# Patient Record
Sex: Male | Born: 1975 | Race: Black or African American | Hispanic: No | Marital: Single | Smoking: Current every day smoker
Health system: Southern US, Community
[De-identification: ages and names within clinical notes are randomized; demographics above are authoritative.]

---

## 2004-05-27 ENCOUNTER — Emergency Department (HOSPITAL_COMMUNITY): Admission: EM | Admit: 2004-05-27 | Discharge: 2004-05-27 | Payer: Self-pay | Admitting: Emergency Medicine

## 2004-11-29 ENCOUNTER — Emergency Department (HOSPITAL_COMMUNITY): Admission: EM | Admit: 2004-11-29 | Discharge: 2004-11-29 | Payer: Self-pay | Admitting: Emergency Medicine

## 2005-09-07 ENCOUNTER — Emergency Department (HOSPITAL_COMMUNITY): Admission: EM | Admit: 2005-09-07 | Discharge: 2005-09-07 | Payer: Self-pay | Admitting: Emergency Medicine

## 2008-10-20 ENCOUNTER — Emergency Department (HOSPITAL_COMMUNITY): Admission: EM | Admit: 2008-10-20 | Discharge: 2008-10-20 | Payer: Self-pay | Admitting: Emergency Medicine

## 2009-04-21 ENCOUNTER — Emergency Department (HOSPITAL_COMMUNITY): Admission: EM | Admit: 2009-04-21 | Discharge: 2009-04-21 | Payer: Self-pay | Admitting: Emergency Medicine

## 2009-06-03 ENCOUNTER — Emergency Department (HOSPITAL_COMMUNITY): Admission: EM | Admit: 2009-06-03 | Discharge: 2009-06-03 | Payer: Self-pay | Admitting: Family Medicine

## 2009-08-11 ENCOUNTER — Emergency Department (HOSPITAL_COMMUNITY): Admission: EM | Admit: 2009-08-11 | Discharge: 2009-08-11 | Payer: Self-pay | Admitting: Family Medicine

## 2009-08-17 ENCOUNTER — Ambulatory Visit: Payer: Self-pay | Admitting: Family Medicine

## 2009-08-17 DIAGNOSIS — K047 Periapical abscess without sinus: Secondary | ICD-10-CM

## 2009-08-17 DIAGNOSIS — K029 Dental caries, unspecified: Secondary | ICD-10-CM | POA: Insufficient documentation

## 2009-08-17 LAB — CONVERTED CEMR LAB
Basophils Absolute: 0 10*3/uL (ref 0.0–0.1)
Hemoglobin: 15.9 g/dL (ref 13.0–17.0)
Lymphocytes Relative: 41 % (ref 12–46)
Monocytes Absolute: 0.5 10*3/uL (ref 0.1–1.0)
Neutro Abs: 3.8 10*3/uL (ref 1.7–7.7)
Platelets: 238 10*3/uL (ref 150–400)
RDW: 14.4 % (ref 11.5–15.5)
Sed Rate: 1 mm/hr (ref 0–16)
Vit D, 25-Hydroxy: 18 ng/mL — ABNORMAL LOW (ref 30–89)

## 2009-08-18 ENCOUNTER — Telehealth (INDEPENDENT_AMBULATORY_CARE_PROVIDER_SITE_OTHER): Payer: Self-pay | Admitting: Family Medicine

## 2010-05-08 ENCOUNTER — Ambulatory Visit: Payer: Self-pay | Admitting: Nurse Practitioner

## 2010-05-08 DIAGNOSIS — B351 Tinea unguium: Secondary | ICD-10-CM | POA: Insufficient documentation

## 2010-05-08 DIAGNOSIS — E669 Obesity, unspecified: Secondary | ICD-10-CM | POA: Insufficient documentation

## 2010-05-08 LAB — CONVERTED CEMR LAB
ALT: 24 units/L (ref 0–53)
Alkaline Phosphatase: 55 units/L (ref 39–117)
Basophils Absolute: 0.1 10*3/uL (ref 0.0–0.1)
Creatinine, Ser: 1.11 mg/dL (ref 0.40–1.50)
Eosinophils Absolute: 0.4 10*3/uL (ref 0.0–0.7)
Eosinophils Relative: 6 % — ABNORMAL HIGH (ref 0–5)
Glucose, Bld: 94 mg/dL (ref 70–99)
HCT: 44.5 % (ref 39.0–52.0)
MCV: 93.1 fL (ref 78.0–100.0)
Monocytes Absolute: 0.5 10*3/uL (ref 0.1–1.0)
Nitrite: NEGATIVE
Platelets: 189 10*3/uL (ref 150–400)
RDW: 14.6 % (ref 11.5–15.5)
Sodium: 143 meq/L (ref 135–145)
Specific Gravity, Urine: <1.005
Total Bilirubin: 0.8 mg/dL (ref 0.3–1.2)
Total Protein: 6.8 g/dL (ref 6.0–8.3)
WBC Urine, dipstick: NEGATIVE
pH: 6

## 2010-05-09 ENCOUNTER — Encounter (INDEPENDENT_AMBULATORY_CARE_PROVIDER_SITE_OTHER): Payer: Self-pay | Admitting: Internal Medicine

## 2010-05-09 ENCOUNTER — Telehealth (INDEPENDENT_AMBULATORY_CARE_PROVIDER_SITE_OTHER): Payer: Self-pay | Admitting: Nurse Practitioner

## 2010-05-10 ENCOUNTER — Encounter (INDEPENDENT_AMBULATORY_CARE_PROVIDER_SITE_OTHER): Payer: Self-pay | Admitting: Nurse Practitioner

## 2010-09-17 IMAGING — CR DG SHOULDER 2+V*L*
3 series · 3 of 3 positions shown · non-contrast
Comparison: None

CLINICAL DATA: Left shoulder pain, injury

LEFT SHOULDER - 2+ VIEW

[w shoulder ap external left]
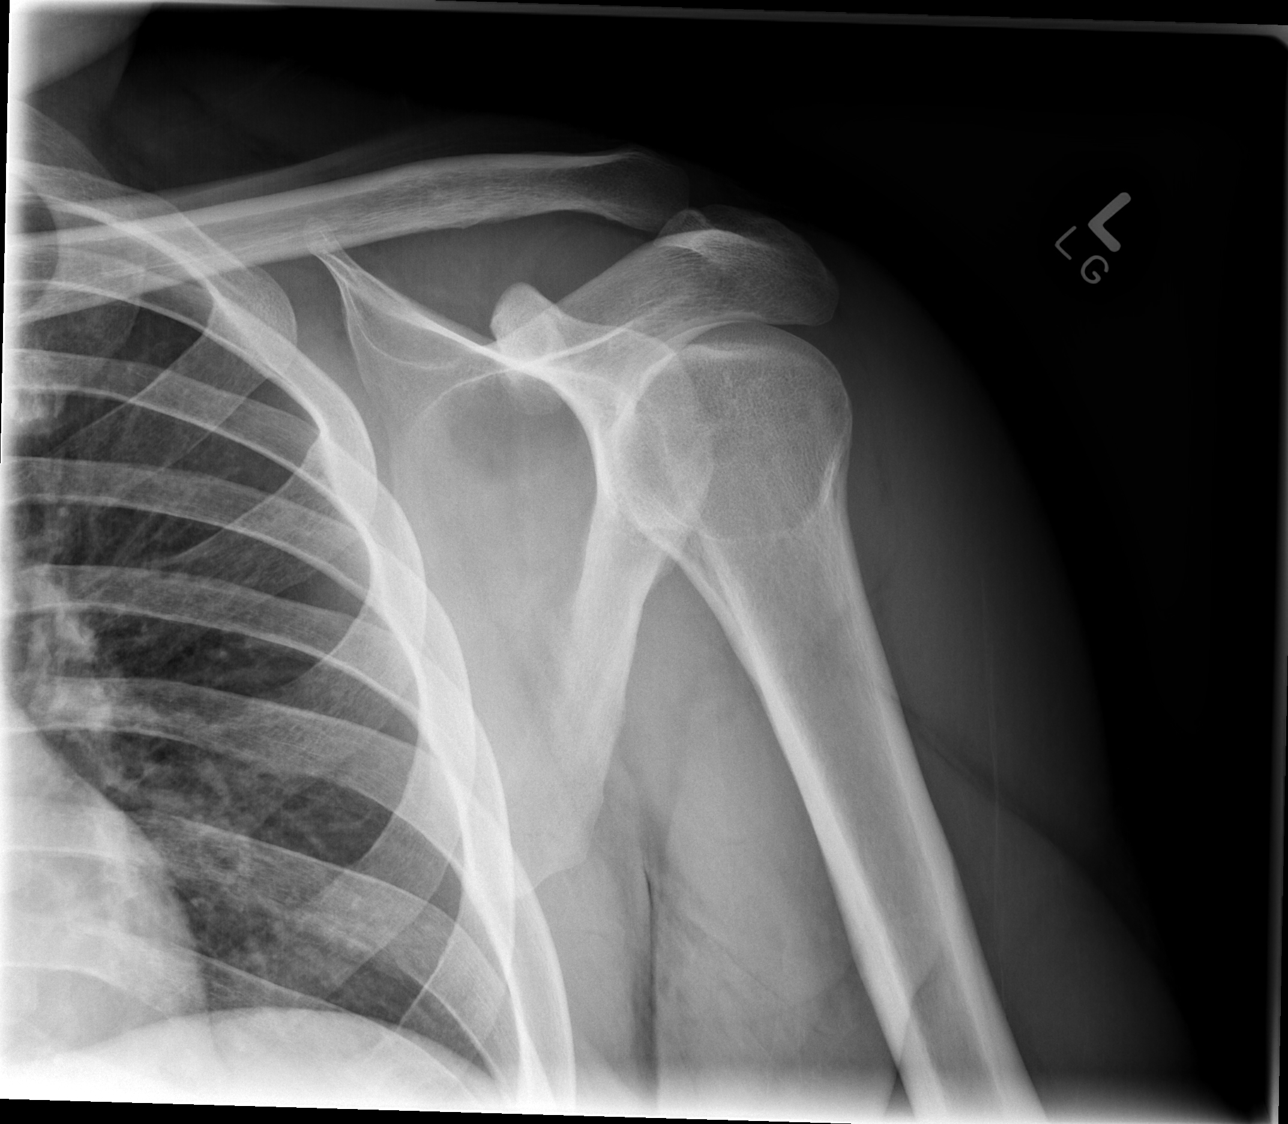

[w shoulder ap internal left]
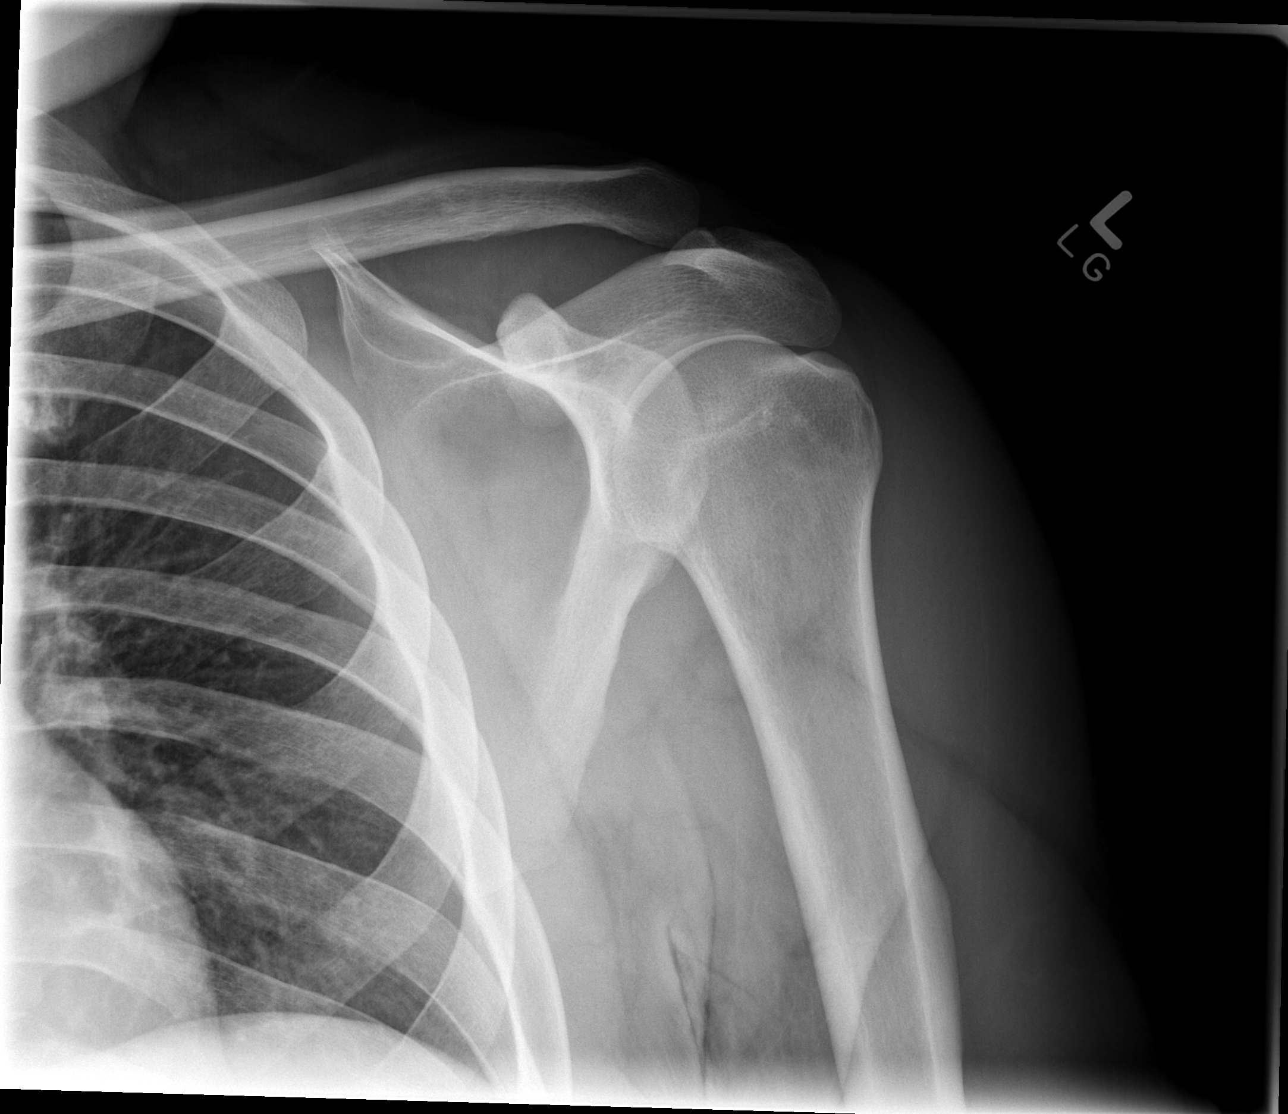

[w shoulder y view left *]
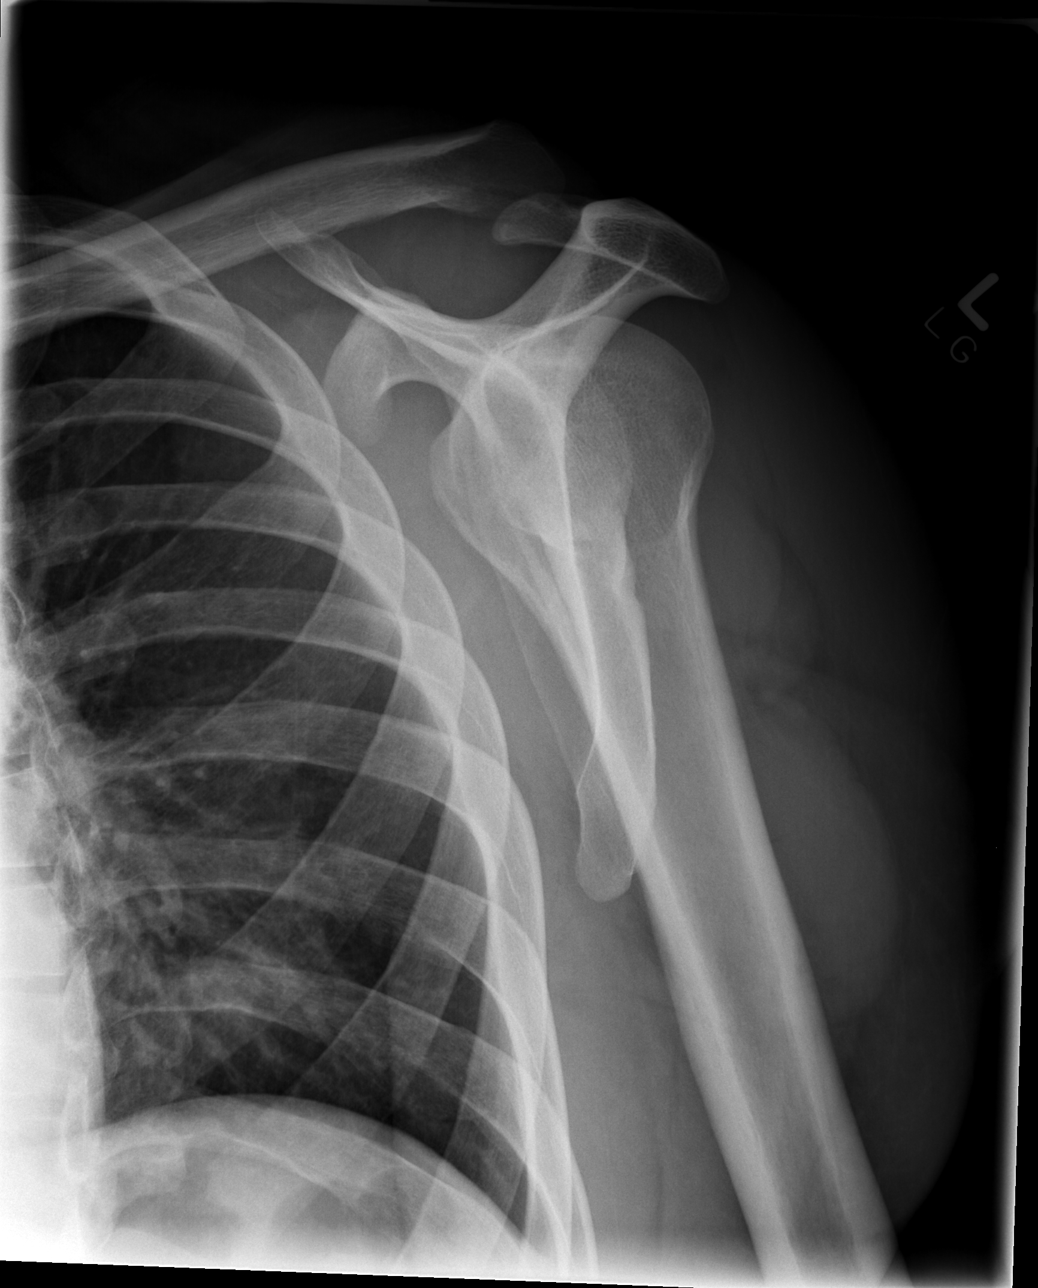

[3 of 3 positions shown; findings below may reference images not displayed]

FINDINGS: Bone mineralization normal.
AC joint alignment normal.
No glenohumeral fracture or dislocation.
Visualized left ribs unremarkable.
IMPRESSION: No acute left shoulder abnormalities.

## 2010-10-31 NOTE — Letter (Signed)
Summary: *HSN Results Follow up  [facility]  [address] [place], [place] [address]   Phone: [phone]  Fax: [phone]      05/10/2010   Bakari Rusher [address] [place], [place]  [address]   Dear  Mr. Hadrian Blanchard,                            ____S.Drinkard,FNP   ____D. Gore,FNP       ____B. McPherson,MD   ____V. Rankins,MD    ____E. Mulberry,MD    __X__N. Daphine Deutscher, FNP  ____D. Reche Dixon, MD    ____K. Philipp Deputy, MD    ____Other     This letter is to inform you that your recent test(s):  _______Pap Smear    ___X____Lab Test     _______X-ray    ___X____ is within acceptable limits  _______ requires a medication change  _______ requires a follow-up lab visit  _______ requires a follow-up visit with your provider   Comments: labs done during recent office visit are normal.       _________________________________________________________ If you have any questions, please contact our office [phone].                    Sincerely,    Lehman Prom FNP [facility]

## 2010-10-31 NOTE — Letter (Signed)
Summary: PT INFORMATION SHEET  PT INFORMATION SHEET   Imported By: Arta Bruce 10/03/2009 14:18:00  _____________________________________________________________________  External Attachment:    Type:   Image     Comment:   External Document

## 2010-10-31 NOTE — Progress Notes (Signed)
Summary: no urine  Phone Note From Other Clinic   Summary of Call: No urine for gc/chl.  received. Initial call taken by: Vesta Mixer CMA,  May 09, 2010 4:09 PM  Follow-up for Phone Call        notify pt that urine was not sent for gon/chl as ordered.  If he wishes to have this STD testing he can return (at no charge) and give a urine sample Follow-up by: Lehman Prom FNP,  May 10, 2010 8:25 AM  Additional Follow-up for Phone Call Additional follow up Details #1::        pt will come in today and give  a specimen at no charge. Additional Follow-up by: Levon Hedger,  May 10, 2010 9:24 AM

## 2010-10-31 NOTE — Assessment & Plan Note (Signed)
Summary: Onychomycosis   Vital Signs:  Patient profile:   35 year old male Weight:      264.4 pounds BMI:     38.07 Pulse rate:   72 / minute Pulse rhythm:   regular Resp:     16 per minute BP sitting:   136 / 70  (left arm) Cuff size:   large  Vitals Entered By: Levon Hedger (May 08, 2010 11:01 AM)  Nutrition Counseling: Patient's BMI is greater than 25 and therefore counseled on weight management options. CC: right foot big toenail needs cutting very long, thick and curving...dog bite on right leg that happened about 3  weeks ago Is Patient Diabetic? No Pain Assessment Patient in pain? no       Does patient need assistance? Functional Status Self care Ambulation Normal Comments pt is currently not taking any medications   Primary Care Provider:  Carolyne Fiscal  CC:  right foot big toenail needs cutting very long and thick and curving...dog bite on right leg that happened about 3  weeks ago.  History of Present Illness:  Pt into the office with complaints of right toenail discoloration. Noted over the past year denies any trauma to toe - no pain only the right great toe affected, no other nails.  Right shin - s/p dog bite 3 weeks ago. Area has still not healed and is draining Foul odor tdap - updated during last visit  Habits & Providers  Alcohol-Tobacco-Diet     Tobacco Status: current  Exercise-Depression-Behavior     Does Patient Exercise: no     Drug Use: yes  Allergies (verified): No Known Drug Allergies  Review of Systems CV:  Denies chest pain or discomfort. Resp:  Denies cough. GI:  Denies abdominal pain, nausea, and vomiting. Derm:  Complains of lesion(s).  Physical Exam  General:  alert.   Head:  normocephalic.   Lungs:  normal breath sounds.   Heart:  normal rate and regular rhythm.   Neurologic:  alert & oriented X3.   Skin:  right shin - crated lesion, exudate (serous) Psych:  Oriented X3.     Impression &  Recommendations:  Problem # 1:  ONYCHOMYCOSIS (ICD-110.1) will refer to podiatry advised pt that labs will be done to check for other causes Orders: T-CBC w/Diff ([id number]) T-Comprehensive Metabolic Panel ([id number]) Rapid HIV  ([id number])  Problem # 2:  DOG BITE (ICD-E906.0) tdap up to date will start on keflex Orders: T-Culture, Wound ([id number])  Complete Medication List: 1)  Cephalexin 500 Mg Caps (Cephalexin) .... One capsule by mouth three times a day for infection  Other Orders: T-GC Probe, urine ([id number]) T-TSH ([phone])  Patient Instructions: 1)  Toe nail - will check labs to be sure there is no other cause for thick toenails. You will be referred to podiatry. 2)  Schedule an appointment to renew your orange card. 3)  Labs will be checked today and you will be notified of the results 4)  left leg - keep area clean.  Keep covered with bandaid while it is still draining.  Take antibiotic Keflex 500mg  by mouth three times a day x 10 days.  apply ointment (samples given) to area daily and cover with a bandaid 5)  Follow up as needed 6)  Schedule an appointment at [facility] for podiatry Prescriptions: CEPHALEXIN 500 MG CAPS (CEPHALEXIN) One capsule by mouth three times a day for infection  #30 x 0   Entered and Authorized by:   Lehman Prom  FNP   Signed by:   Lehman Prom FNP on 05/08/2010   Method used:   Print then Give to Patient   RxID:   [id number]   Laboratory Results   Urine Tests  Date/Time Received: May 08, 2010 1:54 PM  Date/Time Reported: May 08, 2010 1:54 PM   Routine Urinalysis   Color: lt. yellow Glucose: negative   (Normal Range: Negative) Bilirubin: negative   (Normal Range: Negative) Ketone: negative   (Normal Range: Negative) Spec. Gravity: <1.005   (Normal Range: 1.003-1.035) Blood: negative   (Normal Range: Negative) pH: 6.0   (Normal Range: 5.0-8.0) Protein: negative   (Normal Range:  Negative) Urobilinogen: 0.2   (Normal Range: 0-1) Nitrite: negative   (Normal Range: Negative) Leukocyte Esterace: negative   (Normal Range: Negative)    Date/Time Received: May 08, 2010 1:55 PM  Date/Time Reported: May 08, 2010 1:55 PM   Other Tests  Rapid HIV: negative

## 2012-09-10 ENCOUNTER — Emergency Department (HOSPITAL_COMMUNITY)
Admission: EM | Admit: 2012-09-10 | Discharge: 2012-09-10 | Disposition: A | Discharge status: Home / Self Care | Payer: Self-pay | Attending: Emergency Medicine | Admitting: Emergency Medicine

## 2012-09-10 ENCOUNTER — Encounter (HOSPITAL_COMMUNITY): Payer: Self-pay

## 2012-09-10 DIAGNOSIS — Y92410 Unspecified street and highway as the place of occurrence of the external cause: Secondary | ICD-10-CM | POA: Insufficient documentation

## 2012-09-10 DIAGNOSIS — M542 Cervicalgia: Secondary | ICD-10-CM

## 2012-09-10 DIAGNOSIS — S0993XA Unspecified injury of face, initial encounter: Secondary | ICD-10-CM | POA: Insufficient documentation

## 2012-09-10 DIAGNOSIS — Y9389 Activity, other specified: Secondary | ICD-10-CM | POA: Insufficient documentation

## 2012-09-10 DIAGNOSIS — F17200 Nicotine dependence, unspecified, uncomplicated: Secondary | ICD-10-CM | POA: Insufficient documentation

## 2012-09-10 MED ORDER — IBUPROFEN 400 MG PO TABS
800.0000 mg | ORAL_TABLET | Freq: Once | ORAL | Status: AC
  Administered 2012-09-10: 800 mg via ORAL
  Filled 2012-09-10: qty 2

## 2012-09-10 MED ORDER — IBUPROFEN 600 MG PO TABS: 600.0000 mg | ORAL_TABLET | Freq: Four times a day (QID) | ORAL | Status: DC | PRN

## 2012-09-10 MED ORDER — CYCLOBENZAPRINE HCL 10 MG PO TABS
10.0000 mg | ORAL_TABLET | Freq: Once | ORAL | Status: AC
  Administered 2012-09-10: 10 mg via ORAL
  Filled 2012-09-10: qty 1

## 2012-09-10 MED ORDER — CYCLOBENZAPRINE HCL 10 MG PO TABS
10.0000 mg | ORAL_TABLET | Freq: Three times a day (TID) | ORAL | Status: DC | PRN
Start: 2012-09-10 — End: 2015-03-31
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
## 2012-09-11 NOTE — ED Provider Notes (Signed)
Medical screening examination/treatment/procedure(s) were performed by non-physician practitioner and as supervising physician I was immediately available for consultation/collaboration.   Laray Anger, DO 09/11/12 1233

## 2013-04-21 ENCOUNTER — Emergency Department (HOSPITAL_COMMUNITY)
Admission: EM | Admit: 2013-04-21 | Discharge: 2013-04-21 | Disposition: A | Discharge status: Home / Self Care | Payer: Self-pay | Source: Home / Self Care | Attending: Family Medicine | Admitting: Family Medicine

## 2013-04-21 ENCOUNTER — Encounter (HOSPITAL_COMMUNITY): Payer: Self-pay | Admitting: *Deleted

## 2013-04-21 DIAGNOSIS — S0510XA Contusion of eyeball and orbital tissues, unspecified eye, initial encounter: Secondary | ICD-10-CM

## 2013-04-21 DIAGNOSIS — S0592XA Unspecified injury of left eye and orbit, initial encounter: Secondary | ICD-10-CM

## 2013-04-21 MED ORDER — IBUPROFEN 800 MG PO TABS
800.0000 mg | ORAL_TABLET | Freq: Once | ORAL | Status: AC
  Administered 2013-04-21: 800 mg via ORAL

## 2013-04-21 MED ORDER — HYDROCODONE-ACETAMINOPHEN 5-325 MG PO TABS: 1.0000 | ORAL_TABLET | Freq: Four times a day (QID) | ORAL | Status: DC | PRN

## 2013-04-21 MED ORDER — IBUPROFEN 800 MG PO TABS
ORAL_TABLET | ORAL | Status: AC
  Filled 2013-04-21: qty 1

## 2013-04-21 MED ORDER — TOBRAMYCIN 0.3 % OP OINT
TOPICAL_OINTMENT | Freq: Four times a day (QID) | OPHTHALMIC | Status: DC
  Administered 2013-04-21: 19:00:00 via OPHTHALMIC

## 2013-04-21 MED ORDER — TETRACAINE HCL 0.5 % OP SOLN
OPHTHALMIC | Status: AC
  Filled 2013-04-21: qty 2

## 2013-04-21 MED ORDER — TOBRAMYCIN 0.3 % OP OINT
TOPICAL_OINTMENT | OPHTHALMIC | Status: AC
  Filled 2013-04-21: qty 3.5
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
## 2013-04-21 NOTE — ED Provider Notes (Addendum)
   History    CSN: [id number] Arrival date & time 04/21/13  1843  None    Chief Complaint  Patient presents with  . Eye Pain   (Consider location/radiation/quality/duration/timing/severity/associated sxs/prior Treatment) Patient is a 37 y.o. male presenting with eye pain. The history is provided by the patient.  Eye Pain This is a new problem. The current episode started 2 days ago (struck by tree limb in left eye when omving limbs with cousin.). The problem has been gradually improving. Associated symptoms include headaches.   History reviewed. No pertinent past medical history. History reviewed. No pertinent past surgical history. Family History  Problem Relation Age of Onset  . Hypertension Mother    History  Substance Use Topics  . Smoking status: Current Every Day Smoker -- 1.00 packs/day    Types: Cigarettes  . Smokeless tobacco: Not on file  . Alcohol Use: Yes     Comment: occasionally    Review of Systems  Constitutional: Negative.   Eyes: Positive for pain and redness. Negative for photophobia, discharge, itching and visual disturbance.  Neurological: Positive for headaches.    Allergies  Review of patient's allergies indicates no known allergies.  Home Medications   Current Outpatient Rx  Name  Route  Sig  Dispense  Refill  . cyclobenzaprine (FLEXERIL) 10 MG tablet   Oral   Take 1 tablet (10 mg total) by mouth 3 (three) times daily as needed for muscle spasms.   30 tablet   0   . HYDROcodone-acetaminophen (NORCO/VICODIN) 5-325 MG per tablet   Oral   Take 1 tablet by mouth every 6 (six) hours as needed for pain.   6 tablet   0   . ibuprofen (ADVIL,MOTRIN) 600 MG tablet   Oral   Take 1 tablet (600 mg total) by mouth every 6 (six) hours as needed for pain.   30 tablet   0    BP 129/81  Pulse 74  Temp(Src) 98.7 F (37.1 C) (Oral)  Resp 18  SpO2 97% Physical Exam  Nursing note and vitals reviewed. Constitutional: He appears well-developed and  well-nourished. He appears distressed.  Eyes: EOM and lids are normal. Pupils are equal, round, and reactive to light. No foreign bodies found. Right eye exhibits no discharge. Left eye exhibits no discharge. No foreign body present in the left eye. Left conjunctiva is injected. Left conjunctiva has no hemorrhage.  Slit lamp exam:      The left eye shows no corneal abrasion, no corneal ulcer, no foreign body and no fluorescein uptake.  Neck: Normal range of motion. Neck supple.    ED Course  Procedures (including critical care time) Labs Reviewed - No data to display No results found. 1. Eye injury, left, initial encounter     MDM    Linna Hoff, MD 04/21/13 1940  Linna Hoff, MD 04/21/13 2034

## 2015-03-31 ENCOUNTER — Emergency Department (HOSPITAL_COMMUNITY)
Admission: EM | Admit: 2015-03-31 | Discharge: 2015-03-31 | Disposition: A | Discharge status: Home / Self Care | Payer: Self-pay | Attending: Emergency Medicine | Admitting: Emergency Medicine

## 2015-03-31 ENCOUNTER — Encounter (HOSPITAL_COMMUNITY): Payer: Self-pay | Admitting: Emergency Medicine

## 2015-03-31 ENCOUNTER — Emergency Department (HOSPITAL_COMMUNITY): Payer: Self-pay

## 2015-03-31 DIAGNOSIS — Z720 Tobacco use: Secondary | ICD-10-CM | POA: Insufficient documentation

## 2015-03-31 DIAGNOSIS — R079 Chest pain, unspecified: Secondary | ICD-10-CM | POA: Insufficient documentation

## 2015-03-31 LAB — BASIC METABOLIC PANEL
Anion gap: 7 (ref 5–15)
BUN: 9 mg/dL (ref 6–20)
CHLORIDE: 106 mmol/L (ref 101–111)
CO2: 29 mmol/L (ref 22–32)
Calcium: 9 mg/dL (ref 8.9–10.3)
Creatinine, Ser: 1.13 mg/dL (ref 0.61–1.24)
GFR calc Af Amer: >60 mL/min (ref >60)
GLUCOSE: 141 mg/dL — AB (ref 65–99)
POTASSIUM: 3.3 mmol/L — AB (ref 3.5–5.1)
SODIUM: 142 mmol/L (ref 135–145)

## 2015-03-31 LAB — CBC WITH DIFFERENTIAL/PLATELET
Basophils Absolute: 0 10*3/uL (ref 0.0–0.1)
Basophils Relative: 0 % (ref 0–1)
EOS ABS: 0.3 10*3/uL (ref 0.0–0.7)
Eosinophils Relative: 3 % (ref 0–5)
HCT: 43.8 % (ref 39.0–52.0)
Hemoglobin: 15.4 g/dL (ref 13.0–17.0)
LYMPHS PCT: 26 % (ref 12–46)
Lymphs Abs: 3.1 10*3/uL (ref 0.7–4.0)
MCH: 32.8 pg (ref 26.0–34.0)
MCHC: 35.2 g/dL (ref 30.0–36.0)
MCV: 93.2 fL (ref 78.0–100.0)
MONOS PCT: 5 % (ref 3–12)
Monocytes Absolute: 0.6 10*3/uL (ref 0.1–1.0)
NEUTROS PCT: 66 % (ref 43–77)
Neutro Abs: 7.8 10*3/uL — ABNORMAL HIGH (ref 1.7–7.7)
PLATELETS: 176 10*3/uL (ref 150–400)
RBC: 4.7 MIL/uL (ref 4.22–5.81)
RDW: 14.5 % (ref 11.5–15.5)
WBC: 11.8 10*3/uL — ABNORMAL HIGH (ref 4.0–10.5)

## 2015-03-31 LAB — TROPONIN I

## 2015-03-31 LAB — D-DIMER, QUANTITATIVE (NOT AT ARMC)

## 2015-03-31 MED ORDER — MORPHINE SULFATE 4 MG/ML IJ SOLN
4.0000 mg | Freq: Once | INTRAMUSCULAR | Status: AC
  Administered 2015-03-31: 4 mg via INTRAVENOUS
  Filled 2015-03-31: qty 1

## 2015-03-31 MED ORDER — ONDANSETRON HCL 4 MG/2ML IJ SOLN
4.0000 mg | Freq: Once | INTRAMUSCULAR | Status: AC
  Administered 2015-03-31: 4 mg via INTRAVENOUS
  Filled 2015-03-31: qty 2

## 2015-03-31 MED ORDER — NAPROXEN 500 MG PO TABS: 500.0000 mg | ORAL_TABLET | Freq: Two times a day (BID) | ORAL | Status: DC

## 2015-03-31 MED ORDER — KETOROLAC TROMETHAMINE 30 MG/ML IJ SOLN
30.0000 mg | Freq: Once | INTRAMUSCULAR | Status: AC
  Administered 2015-03-31: 30 mg via INTRAVENOUS
  Filled 2015-03-31: qty 1

## 2015-03-31 MED ORDER — TRAMADOL HCL 50 MG PO TABS: 50.0000 mg | ORAL_TABLET | Freq: Four times a day (QID) | ORAL | Status: DC | PRN

## 2015-03-31 NOTE — ED Notes (Signed)
Per Dr. Preston FleetingGlick okay for pt to have water

## 2015-03-31 NOTE — Discharge Instructions (Signed)
Chest Pain (Nonspecific) °It is often hard to give a specific diagnosis for the cause of chest pain. There is always a chance that your pain could be related to something serious, such as a heart attack or a blood clot in the lungs. You need to follow up with your health care provider for further evaluation. °CAUSES  °· Heartburn. °· Pneumonia or bronchitis. °· Anxiety or stress. °· Inflammation around your heart (pericarditis) or lung (pleuritis or pleurisy). °· A blood clot in the lung. °· A collapsed lung (pneumothorax). It can develop suddenly on its own (spontaneous pneumothorax) or from trauma to the chest. °· Shingles infection (herpes zoster virus). °The chest wall is composed of bones, muscles, and cartilage. Any of these can be the source of the pain. °· The bones can be bruised by injury. °· The muscles or cartilage can be strained by coughing or overwork. °· The cartilage can be affected by inflammation and become sore (costochondritis). °DIAGNOSIS  °Lab tests or other studies may be needed to find the cause of your pain. Your health care provider may have you take a test called an ambulatory electrocardiogram (ECG). An ECG records your heartbeat patterns over a 24-hour period. You may also have other tests, such as: °· Transthoracic echocardiogram (TTE). During echocardiography, sound waves are used to evaluate how blood flows through your heart. °· Transesophageal echocardiogram (TEE). °· Cardiac monitoring. This allows your health care provider to monitor your heart rate and rhythm in real time. °· Holter monitor. This is a portable device that records your heartbeat and can help diagnose heart arrhythmias. It allows your health care provider to track your heart activity for several days, if needed. °· Stress tests by exercise or by giving medicine that makes the heart beat faster. °TREATMENT  °· Treatment depends on what may be causing your chest pain. Treatment may include: °¨ Acid blockers for  heartburn. °¨ Anti-inflammatory medicine. °¨ Pain medicine for inflammatory conditions. °¨ Antibiotics if an infection is present. °· You may be advised to change lifestyle habits. This includes stopping smoking and avoiding alcohol, caffeine, and chocolate. °· You may be advised to keep your head raised (elevated) when sleeping. This reduces the chance of acid going backward from your stomach into your esophagus. °Most of the time, nonspecific chest pain will improve within 2-3 days with rest and mild pain medicine.  °HOME CARE INSTRUCTIONS  °· If antibiotics were prescribed, take them as directed. Finish them even if you start to feel better. °· For the next few days, avoid physical activities that bring on chest pain. Continue physical activities as directed. °· Do not use any tobacco products, including cigarettes, chewing tobacco, or electronic cigarettes. °· Avoid drinking alcohol. °· Only take medicine as directed by your health care provider. °· Follow your health care provider's suggestions for further testing if your chest pain does not go away. °· Keep any follow-up appointments you made. If you do not go to an appointment, you could develop lasting (chronic) problems with pain. If there is any problem keeping an appointment, call to reschedule. °SEEK MEDICAL CARE IF:  °· Your chest pain does not go away, even after treatment. °· You have a rash with blisters on your chest. °· You have a fever. °SEEK IMMEDIATE MEDICAL CARE IF:  °· You have increased chest pain or pain that spreads to your arm, neck, jaw, back, or abdomen. °· You have shortness of breath. °· You have an increasing cough, or you cough   up blood. °· You have severe back or abdominal pain. °· You feel nauseous or vomit. °· You have severe weakness. °· You faint. °· You have chills. °This is an emergency. Do not wait to see if the pain will go away. Get medical help at once. Call your local emergency services (911 in [place] Do not drive  yourself to the hospital. °MAKE SURE YOU:  °· Understand these instructions. °· Will watch your condition. °· Will get help right away if you are not doing well or get worse. °Document Released: 06/26/2005 Document Revised: 09/21/2013 Document Reviewed: 04/21/2008 °[employer]® Patient Information ©2015 [employer]. This information is not intended to replace advice given to you by your health care provider. Make sure you discuss any questions you have with your health care provider. ° °Naproxen and naproxen sodium oral immediate-release tablets °What is this medicine? °NAPROXEN (na PROX en) is a non-steroidal anti-inflammatory drug (NSAID). It is used to reduce swelling and to treat pain. This medicine may be used for dental pain, headache, or painful monthly periods. It is also used for painful joint and muscular problems such as arthritis, tendinitis, bursitis, and gout. °This medicine may be used for other purposes; ask your health care provider or pharmacist if you have questions. °COMMON BRAND NAME(S): Aflaxen, Aleve, Aleve Arthritis, All Day Relief, Anaprox, Anaprox DS, Naprosyn °What should I tell my health care provider before I take this medicine? °They need to know if you have any of these conditions: °-asthma °-cigarette smoker °-drink more than 3 alcohol containing drinks a day °-heart disease or circulation problems such as heart failure or leg edema (fluid retention) °-high blood pressure °-kidney disease °-liver disease °-stomach bleeding or ulcers °-an unusual or allergic reaction to naproxen, aspirin, other NSAIDs, other medicines, foods, dyes, or preservatives °-pregnant or trying to get pregnant °-breast-feeding °How should I use this medicine? °Take this medicine by mouth with a glass of water. Follow the directions on the prescription label. Take it with food if your stomach gets upset. Try to not lie down for at least 10 minutes after you take it. Take your medicine at regular intervals. Do not  take your medicine more often than directed. Long-term, continuous use may increase the risk of heart attack or stroke. °A special MedGuide will be given to you by the pharmacist with each prescription and refill. Be sure to read this information carefully each time. °Talk to your pediatrician regarding the use of this medicine in children. Special care may be needed. °Overdosage: If you think you have taken too much of this medicine contact a poison control center or emergency room at once. °NOTE: This medicine is only for you. Do not share this medicine with others. °What if I miss a dose? °If you miss a dose, take it as soon as you can. If it is almost time for your next dose, take only that dose. Do not take double or extra doses. °What may interact with this medicine? °-alcohol °-aspirin °-cidofovir °-diuretics °-lithium °-methotrexate °-other drugs for inflammation like ketorolac or prednisone °-pemetrexed °-probenecid °-warfarin °This list may not describe all possible interactions. Give your health care provider a list of all the medicines, herbs, non-prescription drugs, or dietary supplements you use. Also tell them if you smoke, drink alcohol, or use illegal drugs. Some items may interact with your medicine. °What should I watch for while using this medicine? °Tell your doctor or health care professional if your pain does not get better. Talk to your   doctor before taking another medicine for pain. Do not treat yourself. This medicine does not prevent heart attack or stroke. In fact, this medicine may increase the chance of a heart attack or stroke. The chance may increase with longer use of this medicine and in people who have heart disease. If you take aspirin to prevent heart attack or stroke, talk with your doctor or health care professional. Do not take other medicines that contain aspirin, ibuprofen, or naproxen with this medicine. Side effects such as stomach upset, nausea, or ulcers may be more  likely to occur. Many medicines available without a prescription should not be taken with this medicine. This medicine can cause ulcers and bleeding in the stomach and intestines at any time during treatment. Do not smoke cigarettes or drink alcohol. These increase irritation to your stomach and can make it more susceptible to damage from this medicine. Ulcers and bleeding can happen without warning symptoms and can cause death. You may get drowsy or dizzy. Do not drive, use machinery, or do anything that needs mental alertness until you know how this medicine affects you. Do not stand or sit up quickly, especially if you are an older patient. This reduces the risk of dizzy or fainting spells. This medicine can cause you to bleed more easily. Try to avoid damage to your teeth and gums when you brush or floss your teeth. What side effects may I notice from receiving this medicine? Side effects that you should report to your doctor or health care professional as soon as possible: -black or bloody stools, blood in the urine or vomit -blurred vision -chest pain -difficulty breathing or wheezing -nausea or vomiting -severe stomach pain -skin rash, skin redness, blistering or peeling skin, hives, or itching -slurred speech or weakness on one side of the body -swelling of eyelids, throat, lips -unexplained weight gain or swelling -unusually weak or tired -yellowing of eyes or skin Side effects that usually do not require medical attention (report to your doctor or health care professional if they continue or are bothersome): -constipation -headache -heartburn This list may not describe all possible side effects. Call your doctor for medical advice about side effects. You may report side effects to FDA at 1-800-FDA-1088. Where should I keep my medicine? Keep out of the reach of children. Store at room temperature between 15 and 30 degrees C (59 and 86 degrees F). Keep container tightly closed. Throw  away any unused medicine after the expiration date. NOTE: This sheet is a summary. It may not cover all possible information. If you have questions about this medicine, talk to your doctor, pharmacist, or health care provider.  2015, Elsevier/Gold Standard. (2009-09-18 20:10:16)  Tramadol tablets What is this medicine? TRAMADOL (TRA ma dole) is a pain reliever. It is used to treat moderate to severe pain in adults. This medicine may be used for other purposes; ask your health care provider or pharmacist if you have questions. COMMON BRAND NAME(S): Ultram What should I tell my health care provider before I take this medicine? They need to know if you have any of these conditions: -brain tumor -depression -drug abuse or addiction -head injury -if you frequently drink alcohol containing drinks -kidney disease or trouble passing urine -liver disease -lung disease, asthma, or breathing problems -seizures or epilepsy -suicidal thoughts, plans, or attempt; a previous suicide attempt by you or a family member -an unusual or allergic reaction to tramadol, codeine, other medicines, foods, dyes, or preservatives -pregnant or trying to get  pregnant -breast-feeding How should I use this medicine? Take this medicine by mouth with a full glass of water. Follow the directions on the prescription label. If the medicine upsets your stomach, take it with food or milk. Do not take more medicine than you are told to take. Talk to your pediatrician regarding the use of this medicine in children. Special care may be needed. Overdosage: If you think you have taken too much of this medicine contact a poison control center or emergency room at once. NOTE: This medicine is only for you. Do not share this medicine with others. What if I miss a dose? If you miss a dose, take it as soon as you can. If it is almost time for your next dose, take only that dose. Do not take double or extra doses. What may interact  with this medicine? Do not take this medicine with any of the following medications: -MAOIs like Carbex, Eldepryl, Marplan, Nardil, and Parnate This medicine may also interact with the following medications: -alcohol or medicines that contain alcohol -antihistamines -benzodiazepines -bupropion -carbamazepine or oxcarbazepine -clozapine -cyclobenzaprine -digoxin -furazolidone -linezolid -medicines for depression, anxiety, or psychotic disturbances -medicines for migraine headache like almotriptan, eletriptan, frovatriptan, naratriptan, rizatriptan, sumatriptan, zolmitriptan -medicines for pain like pentazocine, buprenorphine, butorphanol, meperidine, nalbuphine, and propoxyphene -medicines for sleep -muscle relaxants -naltrexone -phenobarbital -phenothiazines like perphenazine, thioridazine, chlorpromazine, mesoridazine, fluphenazine, prochlorperazine, promazine, and trifluoperazine -procarbazine -warfarin This list may not describe all possible interactions. Give your health care provider a list of all the medicines, herbs, non-prescription drugs, or dietary supplements you use. Also tell them if you smoke, drink alcohol, or use illegal drugs. Some items may interact with your medicine. What should I watch for while using this medicine? Tell your doctor or health care professional if your pain does not go away, if it gets worse, or if you have new or a different type of pain. You may develop tolerance to the medicine. Tolerance means that you will need a higher dose of the medicine for pain relief. Tolerance is normal and is expected if you take this medicine for a long time. Do not suddenly stop taking your medicine because you may develop a severe reaction. Your body becomes used to the medicine. This does NOT mean you are addicted. Addiction is a behavior related to getting and using a drug for a non-medical reason. If you have pain, you have a medical reason to take pain medicine. Your  doctor will tell you how much medicine to take. If your doctor wants you to stop the medicine, the dose will be slowly lowered over time to avoid any side effects. You may get drowsy or dizzy. Do not drive, use machinery, or do anything that needs mental alertness until you know how this medicine affects you. Do not stand or sit up quickly, especially if you are an older patient. This reduces the risk of dizzy or fainting spells. Alcohol can increase or decrease the effects of this medicine. Avoid alcoholic drinks. You may have constipation. Try to have a bowel movement at least every 2 to 3 days. If you do not have a bowel movement for 3 days, call your doctor or health care professional. Your mouth may get dry. Chewing sugarless gum or sucking hard candy, and drinking plenty of water may help. Contact your doctor if the problem does not go away or is severe. What side effects may I notice from receiving this medicine? Side effects that you should report to your doctor  or health care professional as soon as possible: -allergic reactions like skin rash, itching or hives, swelling of the face, lips, or tongue -breathing difficulties, wheezing -confusion -itching -light headedness or fainting spells -redness, blistering, peeling or loosening of the skin, including inside the mouth -seizures Side effects that usually do not require medical attention (report to your doctor or health care professional if they continue or are bothersome): -constipation -dizziness -drowsiness -headache -nausea, vomiting This list may not describe all possible side effects. Call your doctor for medical advice about side effects. You may report side effects to FDA at 1-800-FDA-1088. Where should I keep my medicine? Keep out of the reach of children. Store at room temperature between 15 and 30 degrees C (59 and 86 degrees F). Keep container tightly closed. Throw away any unused medicine after the expiration date. NOTE:  This sheet is a summary. It may not cover all possible information. If you have questions about this medicine, talk to your doctor, pharmacist, or health care provider.  2015, [employer]vier/Gold Standard. (2010-05-30 11:55:44)

## 2015-03-31 NOTE — ED Provider Notes (Signed)
CSN: [id number]     Arrival date & time 03/31/15  0414 History   First MD Initiated Contact with Patient 03/31/15 [phone]     Chief Complaint  Patient presents with  . Chest Pain     (Consider location/radiation/quality/duration/timing/severity/associated sxs/prior Treatment) Patient is a 39 y.o. male presenting with chest pain. The history is provided by the patient.  Chest Pain He started having sharp midsternal chest pain at about 8 PM after eating a bologna and cheese exam which. There is some radiation of pain to the back. Pain is worse with swallowing, deep breathing, movement. Nothing makes it better. He went to sleep and woke up at 3 AM with pain much more severe. He rated pain at 10/10. There is associated nausea and vomiting. At no associated dyspnea, diaphoresis. He was brought in by EMS who gave him nitroglycerin without relief. He states he tried to take some aspirin at home but vomited after taking. He does smoke 1 pack of cigarettes a day but there is no history of diabetes or hypertension or hyperlipidemia. There is no family history of coronary artery disease.  No past medical history on file. No past surgical history on file. Family History  Problem Relation Age of Onset  . Hypertension Mother    History  Substance Use Topics  . Smoking status: Current Every Day Smoker -- 1.00 packs/day    Types: Cigarettes  . Smokeless tobacco: Not on file  . Alcohol Use: Yes     Comment: occasionally    Review of Systems  Cardiovascular: Positive for chest pain.  All other systems reviewed and are negative.     Allergies  Review of patient's allergies indicates no known allergies.  Home Medications   Prior to Admission medications   Medication Sig Start Date End Date Taking? Authorizing Provider  cyclobenzaprine (FLEXERIL) 10 MG tablet Take 1 tablet (10 mg total) by mouth 3 (three) times daily as needed for muscle spasms. 09/10/12   Jethro Bastos, NP   HYDROcodone-acetaminophen (NORCO/VICODIN) 5-325 MG per tablet Take 1 tablet by mouth every 6 (six) hours as needed for pain. 04/21/13   Linna Hoff, MD  ibuprofen (ADVIL,MOTRIN) 600 MG tablet Take 1 tablet (600 mg total) by mouth every 6 (six) hours as needed for pain. 09/10/12   Jethro Bastos, NP   Ht  (1.778 m)  Wt 215 lb (97.523 kg)  BMI 30.85 kg/m2  SpO2 100% Physical Exam  Nursing note and vitals reviewed.  39 year old male, who appears to be in pain, but is in no acute distress. Vital signs are normal. Oxygen saturation is 100%, which is normal. Head is normocephalic and atraumatic. PERRLA, EOMI. Oropharynx is clear. Neck is nontender and supple without adenopathy or JVD. Back is nontender and there is no CVA tenderness. Lungs are clear without rales, wheezes, or rhonchi. Chest is nontender. Heart has regular rate and rhythm without murmur. Abdomen is soft, flat, nontender without masses or hepatosplenomegaly and peristalsis is normoactive. Extremities have no cyanosis or edema, full range of motion is present. Skin is warm and dry without rash. Neurologic: Mental status is normal, cranial nerves are intact, there are no motor or sensory deficits.  ED Course  Procedures (including critical care time) Labs Review Results for orders placed or performed during the hospital encounter of 03/31/15  Basic metabolic panel  Result Value Ref Range   Sodium 142 135 - 145 mmol/L   Potassium 3.3 (L) 3.5 - 5.1 mmol/L  Chloride 106 101 - 111 mmol/L   CO2 29 22 - 32 mmol/L   Glucose, Bld 141 (H) 65 - 99 mg/dL   BUN 9 6 - 20 mg/dL   Creatinine, Ser 1.611.13 0.61 - 1.24 mg/dL   Calcium 9.0 8.9 - 09.610.3 mg/dL   GFR calc non Af Amer >60 >60 mL/min   GFR calc Af Amer >60 >60 mL/min   Anion gap 7 5 - 15  CBC with Differential  Result Value Ref Range   WBC 11.8 (H) 4.0 - 10.5 K/uL   RBC 4.70 4.22 - 5.81 MIL/uL   Hemoglobin 15.4 13.0 - 17.0 g/dL   HCT 04.543.8 40.939.0 - 81.152.0 %   MCV 93.2  78.0 - 100.0 fL   MCH 32.8 26.0 - 34.0 pg   MCHC 35.2 30.0 - 36.0 g/dL   RDW 91.414.5 78.211.5 - 95.615.5 %   Platelets 176 150 - 400 K/uL   Neutrophils Relative % 66 43 - 77 %   Neutro Abs 7.8 (H) 1.7 - 7.7 K/uL   Lymphocytes Relative 26 12 - 46 %   Lymphs Abs 3.1 0.7 - 4.0 K/uL   Monocytes Relative 5 3 - 12 %   Monocytes Absolute 0.6 0.1 - 1.0 K/uL   Eosinophils Relative 3 0 - 5 %   Eosinophils Absolute 0.3 0.0 - 0.7 K/uL   Basophils Relative 0 0 - 1 %   Basophils Absolute 0.0 0.0 - 0.1 K/uL  Troponin I  Result Value Ref Range   Troponin I <0.03 <0.031 ng/mL  D-dimer, quantitative  Result Value Ref Range   D-Dimer, Quant <0.27 0.00 - 0.48 ug/mL-FEU   Imaging Review Dg Chest 2 View  03/31/2015   CLINICAL DATA:  Chest pain since 20:00, not responding to nitroglycerin.  EXAM: CHEST  2 VIEW  COMPARISON:  None.  FINDINGS: The heart size and mediastinal contours are within normal limits. Both lungs are clear. The visualized skeletal structures are unremarkable.  IMPRESSION: No active cardiopulmonary disease.   Electronically Signed   By: Ellery Plunkaniel R Staffieri M.D.   On: 03/31/2015 05:15     EKG Interpretation   Date/Time:  Friday March 31 2015 04:20:17 EDT Ventricular Rate:  89 PR Interval:  192 QRS Duration: 94 QT Interval:  354 QTC Calculation: 431 R Axis:   59 Text Interpretation:  Sinus rhythm Normal ECG No old tracing to compare  Confirmed by [facility] LLCGL[facility]  MD, Odis Turck ([id number]) on 03/31/2015 4:26:23 AM      MDM   Final diagnoses:  Chest pain, unspecified chest pain type    Chest pain of uncertain cause. ECG is normal. Chest x-ray will be obtained as well as d-dimer and he will be given a therapeutic trial of ketorolac. Heart score is 1 which puts him at very low risk of cardiac event.  He had no relief with ketorolac. He was given a small dose of morphine with excellent relief of pain. ED workup is unremarkable. He is referred back to his PCP for follow-up and is discharged with prescriptions for  naproxen and tramadol.  Dione Boozeavid Cloa Bushong, MD 03/31/15 [phone]

## 2015-03-31 NOTE — ED Notes (Signed)
Pt arrives from home via [facility] c/o CP since 2000.  Pt reports mild pain last night, able to sleep, awaken from sleep with 10/10 CP.  EMS reports giving 1NTG, no change in pain.  Pt appears tearful and in distress. Pt reports painful to swallow.

## 2015-03-31 NOTE — ED Notes (Signed)
Dr. Glick at bedside.  

## 2015-12-20 ENCOUNTER — Emergency Department (HOSPITAL_COMMUNITY)
Admission: EM | Admit: 2015-12-20 | Discharge: 2015-12-20 | Disposition: A | Discharge status: Home / Self Care | Payer: No Typology Code available for payment source | Attending: Emergency Medicine | Admitting: Emergency Medicine

## 2015-12-20 ENCOUNTER — Encounter (HOSPITAL_COMMUNITY): Payer: Self-pay | Admitting: *Deleted

## 2015-12-20 DIAGNOSIS — I10 Essential (primary) hypertension: Secondary | ICD-10-CM | POA: Insufficient documentation

## 2015-12-20 DIAGNOSIS — B349 Viral infection, unspecified: Secondary | ICD-10-CM

## 2015-12-20 DIAGNOSIS — F17210 Nicotine dependence, cigarettes, uncomplicated: Secondary | ICD-10-CM | POA: Insufficient documentation

## 2015-12-20 DIAGNOSIS — Z791 Long term (current) use of non-steroidal anti-inflammatories (NSAID): Secondary | ICD-10-CM | POA: Insufficient documentation

## 2015-12-20 LAB — COMPREHENSIVE METABOLIC PANEL
ALT: 20 U/L (ref 17–63)
ANION GAP: 11 (ref 5–15)
AST: 27 U/L (ref 15–41)
Albumin: 3.8 g/dL (ref 3.5–5.0)
Alkaline Phosphatase: 57 U/L (ref 38–126)
BILIRUBIN TOTAL: 0.6 mg/dL (ref 0.3–1.2)
BUN: 10 mg/dL (ref 6–20)
CHLORIDE: 102 mmol/L (ref 101–111)
CO2: 25 mmol/L (ref 22–32)
Calcium: 9.5 mg/dL (ref 8.9–10.3)
Creatinine, Ser: 1.16 mg/dL (ref 0.61–1.24)
Glucose, Bld: 128 mg/dL — ABNORMAL HIGH (ref 65–99)
POTASSIUM: 3.6 mmol/L (ref 3.5–5.1)
Sodium: 138 mmol/L (ref 135–145)
TOTAL PROTEIN: 6.8 g/dL (ref 6.5–8.1)

## 2015-12-20 LAB — CBC
HEMATOCRIT: 48.7 % (ref 39.0–52.0)
Hemoglobin: 17.8 g/dL — ABNORMAL HIGH (ref 13.0–17.0)
MCH: 33.5 pg (ref 26.0–34.0)
MCHC: 36.6 g/dL — ABNORMAL HIGH (ref 30.0–36.0)
MCV: 91.7 fL (ref 78.0–100.0)
Platelets: 160 10*3/uL (ref 150–400)
RBC: 5.31 MIL/uL (ref 4.22–5.81)
RDW: 13.7 % (ref 11.5–15.5)
WBC: 5.6 10*3/uL (ref 4.0–10.5)

## 2015-12-20 LAB — LIPASE, BLOOD: LIPASE: 33 U/L (ref 11–51)

## 2015-12-20 MED ORDER — ONDANSETRON 4 MG PO TBDP
4.0000 mg | ORAL_TABLET | Freq: Once | ORAL | Status: AC | PRN
  Administered 2015-12-20: 4 mg via ORAL

## 2015-12-20 MED ORDER — ONDANSETRON 4 MG PO TBDP
ORAL_TABLET | ORAL | Status: AC
  Filled 2015-12-20: qty 1

## 2015-12-20 MED ORDER — SODIUM CHLORIDE 0.9 % IV BOLUS (SEPSIS)
1000.0000 mL | Freq: Once | INTRAVENOUS | Status: AC
  Administered 2015-12-20: 1000 mL via INTRAVENOUS

## 2015-12-20 MED ORDER — ONDANSETRON HCL 4 MG/2ML IJ SOLN
4.0000 mg | Freq: Once | INTRAMUSCULAR | Status: AC
  Administered 2015-12-20: 4 mg via INTRAVENOUS
  Filled 2015-12-20: qty 2

## 2015-12-20 MED ORDER — ONDANSETRON HCL 4 MG PO TABS: 4.0000 mg | ORAL_TABLET | Freq: Three times a day (TID) | ORAL | Status: DC | PRN

## 2015-12-20 NOTE — Discharge Instructions (Signed)
Please read and follow all provided instructions.  Your diagnoses today include:  1. Viral syndrome    Tests performed today include:  Vital signs. See below for your results today.   Medications prescribed:   Take medication as prescribed   Home care instructions:  Follow any educational materials contained in this packet.  Follow-up instructions: Please follow-up with your primary care provider in the next 48 hours for further evaluation of symptoms and treatment   Return instructions:   Please return to the Emergency Department if you do not get better, if you get worse, or new symptoms OR  - Fever (temperature greater than 101.35F)  - Bleeding that does not stop with holding pressure to the area    -Severe pain (please note that you may be more sore the day after your accident)  - Chest Pain  - Difficulty breathing  - Severe nausea or vomiting  - Inability to tolerate food and liquids  - Passing out  - Skin becoming red around your wounds  - Change in mental status (confusion or lethargy)  - New numbness or weakness     Please return if you have any other emergent concerns.  Additional Information:  Your vital signs today were: BP 126/67 mmHg   Pulse 45   Temp(Src) 98.1 F (36.7 C) (Oral)   Resp 16   SpO2 100% If your blood pressure (BP) was elevated above 135/85 this visit, please have this repeated by your doctor within one month. ---------------

## 2015-12-20 NOTE — ED Notes (Signed)
Pt reports bodyaches, chills, abd pain, vomiting since Sunday. Denies headache, denies diarrhea.

## 2015-12-20 NOTE — ED Provider Notes (Signed)
CSN: [id number]     Arrival date & time 12/20/15  1109 History   First MD Initiated Contact with Patient 12/20/15 1438     Chief Complaint  Patient presents with  . Emesis  . Abdominal Pain   (Consider location/radiation/quality/duration/timing/severity/associated sxs/prior Treatment) HPI 40 y.o. male presents to the Emergency Department today complaining of generalized body aches since Sunday. Associated N/V/D. Notes emesis x3. No fevers. No CP/SOB. No cough. No rhinorrhea, No congestion. States body aches are 8/10 on pain scale and are an aching sensation. Has not tried any OTC treatments. Notes sick contacts. No other symptoms noted.    History reviewed. No pertinent past medical history. History reviewed. No pertinent past surgical history. Family History  Problem Relation Age of Onset  . Hypertension Mother    Social History  Substance Use Topics  . Smoking status: Current Every Day Smoker -- 1.00 packs/day    Types: Cigarettes  . Smokeless tobacco: None  . Alcohol Use: 12.0 oz/week    20 Cans of beer per week     Comment: "40 oz/day most days"    Review of Systems ROS reviewed and all are negative for acute change except as noted in the HPI.   Allergies  Review of patient's allergies indicates no known allergies.  Home Medications   Prior to Admission medications   Medication Sig Start Date End Date Taking? Authorizing Provider  naproxen (NAPROSYN) 500 MG tablet Take 1 tablet (500 mg total) by mouth 2 (two) times daily. 03/31/15   Dione Boozeavid Glick, MD  traMADol (ULTRAM) 50 MG tablet Take 1 tablet (50 mg total) by mouth every 6 (six) hours as needed. 03/31/15   Dione Boozeavid Glick, MD   BP 126/67 mmHg  Pulse 45  Temp(Src) 98.1 F (36.7 C) (Oral)  Resp 16  SpO2 100%   Physical Exam  Constitutional: He is oriented to person, place, and time. He appears well-developed and well-nourished. No distress.  HENT:  Head: Normocephalic and atraumatic.  Right Ear: Tympanic membrane,  external ear and ear canal normal.  Left Ear: Tympanic membrane, external ear and ear canal normal.  Nose: Nose normal.  Mouth/Throat: Uvula is midline, oropharynx is clear and moist and mucous membranes are normal. No trismus in the jaw. No oropharyngeal exudate, posterior oropharyngeal erythema or tonsillar abscesses.  Eyes: EOM are normal. Pupils are equal, round, and reactive to light.  Neck: Normal range of motion. Neck supple. No tracheal deviation present.  Cardiovascular: Normal rate, regular rhythm, S1 normal, S2 normal, normal heart sounds, intact distal pulses and normal pulses.   Pulmonary/Chest: Effort normal and breath sounds normal. No respiratory distress. He has no decreased breath sounds. He has no wheezes. He has no rhonchi. He has no rales.  Abdominal: Normal appearance and bowel sounds are normal. There is no tenderness.  Musculoskeletal: Normal range of motion.  Neurological: He is alert and oriented to person, place, and time.  Skin: Skin is warm and dry.  Psychiatric: He has a normal mood and affect. His speech is normal and behavior is normal. Thought content normal.    ED Course  Procedures (including critical care time) Labs Review Labs Reviewed  COMPREHENSIVE METABOLIC PANEL - Abnormal; Notable for the following:    Glucose, Bld 128 (*)    All other components within normal limits  CBC - Abnormal; Notable for the following:    Hemoglobin 17.8 (*)    MCHC 36.6 (*)    All other components within normal limits  LIPASE,  BLOOD   Imaging Review No results found. I have personally reviewed and evaluated these images and lab results as part of my medical decision-making.   EKG Interpretation None      MDM  I have reviewed and evaluated the relevant laboratory values.I have reviewed and evaluated the relevant imaging studies.I personally evaluated and interpreted the relevant EKG.I have reviewed the relevant previous healthcare records.I have reviewed EMS  Documentation.I obtained HPI from historian. Patient discussed with supervising physician  ED Course:  Assessment: Patient is a 39yM presents with abdominal pain since Sunday. On exam, nontoxic, nonseptic appearing, in no apparent distress. Patient's pain and other symptoms adequately managed in emergency department.  Fluid bolus given.  Labs, and vitals reviewed.  Patient does not meet the SIRS or Sepsis criteria.  On repeat exam patient does not have a surgical abdomen and there are no peritoneal signs.  No indication of appendicitis, bowel obstruction, bowel perforation, cholecystitis, diverticulitis. Patient discharged home with symptomatic treatment and given strict instructions for follow-up with their primary care physician.  I have also discussed reasons to return immediately to the ER.  Patient expresses understanding and agrees with plan.  Disposition/Plan:  DC Home Additional Verbal discharge instructions given and discussed with patient.  Pt Instructed to f/u with PCP in the next 48-72 hours for evaluation and treatment of symptoms. Return precautions given Pt acknowledges and agrees with plan  Supervising Physician Rolan Bucco, MD   Final diagnoses:  Viral syndrome       Audry Pili, PA-C 12/20/15 1910  Rolan Bucco, MD 12/22/15 [phone]

## 2017-05-10 ENCOUNTER — Encounter (HOSPITAL_COMMUNITY): Payer: Self-pay

## 2017-05-10 ENCOUNTER — Emergency Department (HOSPITAL_COMMUNITY)
Admission: EM | Admit: 2017-05-10 | Discharge: 2017-05-10 | Disposition: A | Discharge status: Home / Self Care | Payer: Self-pay | Attending: Emergency Medicine | Admitting: Emergency Medicine

## 2017-05-10 DIAGNOSIS — F17210 Nicotine dependence, cigarettes, uncomplicated: Secondary | ICD-10-CM | POA: Insufficient documentation

## 2017-05-10 DIAGNOSIS — R1033 Periumbilical pain: Secondary | ICD-10-CM | POA: Insufficient documentation

## 2017-05-10 LAB — CBC
HCT: 40.3 % (ref 39.0–52.0)
HEMOGLOBIN: 14.6 g/dL (ref 13.0–17.0)
MCH: 33.2 pg (ref 26.0–34.0)
MCHC: 36.2 g/dL — ABNORMAL HIGH (ref 30.0–36.0)
MCV: 91.6 fL (ref 78.0–100.0)
Platelets: 192 10*3/uL (ref 150–400)
RBC: 4.4 MIL/uL (ref 4.22–5.81)
RDW: 13.9 % (ref 11.5–15.5)
WBC: 6.4 10*3/uL (ref 4.0–10.5)

## 2017-05-10 LAB — COMPREHENSIVE METABOLIC PANEL
ALBUMIN: 3.4 g/dL — AB (ref 3.5–5.0)
ALK PHOS: 45 U/L (ref 38–126)
ALT: 19 U/L (ref 17–63)
ANION GAP: 5 (ref 5–15)
AST: 22 U/L (ref 15–41)
BUN: 7 mg/dL (ref 6–20)
CALCIUM: 8.8 mg/dL — AB (ref 8.9–10.3)
CO2: 28 mmol/L (ref 22–32)
Chloride: 105 mmol/L (ref 101–111)
Creatinine, Ser: 1.1 mg/dL (ref 0.61–1.24)
GFR calc non Af Amer: >60 mL/min (ref >60)
Glucose, Bld: 96 mg/dL (ref 65–99)
POTASSIUM: 3.6 mmol/L (ref 3.5–5.1)
SODIUM: 138 mmol/L (ref 135–145)
Total Bilirubin: 1.1 mg/dL (ref 0.3–1.2)
Total Protein: 5.5 g/dL — ABNORMAL LOW (ref 6.5–8.1)

## 2017-05-10 LAB — URINALYSIS, ROUTINE W REFLEX MICROSCOPIC
Bilirubin Urine: NEGATIVE
Glucose, UA: NEGATIVE mg/dL
HGB URINE DIPSTICK: NEGATIVE
Ketones, ur: NEGATIVE mg/dL
Leukocytes, UA: NEGATIVE
NITRITE: NEGATIVE
PH: 6 (ref 5.0–8.0)
Protein, ur: NEGATIVE mg/dL
SPECIFIC GRAVITY, URINE: 1.021 (ref 1.005–1.030)

## 2017-05-10 LAB — LIPASE, BLOOD: LIPASE: 31 U/L (ref 11–51)

## 2017-05-10 MED ORDER — SODIUM CHLORIDE 0.9 % IV BOLUS (SEPSIS)
1000.0000 mL | Freq: Once | INTRAVENOUS | Status: AC
  Administered 2017-05-10: 1000 mL via INTRAVENOUS

## 2017-05-10 MED ORDER — PROMETHAZINE HCL 25 MG PO TABS: 25.0000 mg | ORAL_TABLET | Freq: Four times a day (QID) | ORAL | 0 refills | Status: DC | PRN

## 2017-05-10 MED ORDER — ONDANSETRON HCL 4 MG/2ML IJ SOLN
4.0000 mg | Freq: Once | INTRAMUSCULAR | Status: AC
  Administered 2017-05-10: 4 mg via INTRAVENOUS
  Filled 2017-05-10: qty 2

## 2017-05-10 NOTE — ED Triage Notes (Addendum)
PT C/O GENERALIZED ABDOMINAL PAIN RADIATING TO THE RIGHT FLANK, LOSS OF APPETITE, AND NAUSEA SINCE Tuesday AFTER DONATING PLASMA. DENIES DIARRHEA, FEVER, OR URINARY SYMPTOMS.

## 2017-05-10 NOTE — Discharge Instructions (Signed)
Return here as needed. Follow up with a primary doctor. Your testing here today was normal. Slowly increase your fluid intake.

## 2017-05-17 NOTE — ED Provider Notes (Signed)
[facility] Provider Note   CSN: [id number] Arrival date & time: 05/10/17  4098     History   Chief Complaint Chief Complaint  Patient presents with  . Abdominal Pain    HPI Scott Riggs is a 41 y.o. male.  HPI Patient presents to the emergency department with flank discomfort, loss of appetite, nausea since Tuesday after donating plasma.  The patient states he also donated plasma on Thursday and states that after that he felt worse.  The patient states that he did not take any medications prior to arrival.  Patient states he did not actually vomit.  He states that nothing seems make the condition better or worse.  He states he feels like he has been unable to eat due to the nausea. The patient denies chest pain, shortness of breath, headache,blurred vision, neck pain, fever, cough, weakness, numbness, dizziness, anorexia, edema, vomiting, diarrhea, rash, back pain, dysuria, hematemesis, bloody stool, near syncope, or syncope. History reviewed. No pertinent past medical history.  Patient Active Problem List   Diagnosis Date Noted  . ONYCHOMYCOSIS 05/08/2010  . OBESITY 05/08/2010  . DENTAL CARIES 08/17/2009  . ABSCESS, TOOTH 08/17/2009    History reviewed. No pertinent surgical history.     Home Medications    Prior to Admission medications   Medication Sig Start Date End Date Taking? Authorizing Provider  promethazine (PHENERGAN) 25 MG tablet Take 1 tablet (25 mg total) by mouth every 6 (six) hours as needed for nausea or vomiting. 05/10/17   Charlestine Night, PA-C    Family History Family History  Problem Relation Age of Onset  . Hypertension Mother     Social History Social History  Substance Use Topics  . Smoking status: Current Every Day Smoker    Packs/day: 1.00    Types: Cigarettes  . Smokeless tobacco: Never Used  . Alcohol use 12.0 oz/week    20 Cans of beer per week     Comment: "40 oz/day most days"     Allergies   Patient has no  known allergies.   Review of Systems Review of Systems All other systems negative except as documented in the HPI. All pertinent positives and negatives as reviewed in the HPI.  Physical Exam Updated Vital Signs BP 137/84 (BP Location: Left Arm)   Pulse (!) 55   Temp 98 F (36.7 C) (Oral)   Resp 18   Ht 5\' 10"  (1.778 m)   Wt 88.5 kg (195 lb)   SpO2 99%   BMI 27.98 kg/m   Physical Exam  Constitutional: He is oriented to person, place, and time. He appears well-developed and well-nourished. No distress.  HENT:  Head: Normocephalic and atraumatic.  Mouth/Throat: Oropharynx is clear and moist.  Eyes: Pupils are equal, round, and reactive to light.  Neck: Normal range of motion. Neck supple.  Cardiovascular: Normal rate, regular rhythm and normal heart sounds.  Exam reveals no gallop and no friction rub.   No murmur heard. Pulmonary/Chest: Effort normal and breath sounds normal. No respiratory distress. He has no wheezes.  Abdominal: Soft. Bowel sounds are normal. He exhibits no distension and no mass. There is no tenderness. There is no rebound and no guarding.  Neurological: He is alert and oriented to person, place, and time. He exhibits normal muscle tone. Coordination normal.  Skin: Skin is warm and dry. Capillary refill takes less than 2 seconds. No rash noted. No erythema.  Psychiatric: He has a normal mood and affect. His behavior is normal.  Nursing note and vitals reviewed.    ED Treatments / Results  Labs (all labs ordered are listed, but only abnormal results are displayed) Labs Reviewed  COMPREHENSIVE METABOLIC PANEL - Abnormal; Notable for the following:       Result Value   Calcium 8.8 (*)    Total Protein 5.5 (*)    Albumin 3.4 (*)    All other components within normal limits  CBC - Abnormal; Notable for the following:    MCHC 36.2 (*)    All other components within normal limits  LIPASE, BLOOD  URINALYSIS, ROUTINE W REFLEX MICROSCOPIC    EKG  EKG  Interpretation None       Radiology No results found.  Procedures Procedures (including critical care time)  Medications Ordered in ED Medications  sodium chloride 0.9 % bolus 1,000 mL (0 mLs Intravenous Stopped 05/10/17 1144)  ondansetron (ZOFRAN) injection 4 mg (4 mg Intravenous Given 05/10/17 0851)     Initial Impression / Assessment and Plan / ED Course  I have reviewed the triage vital signs and the nursing notes.  Pertinent labs & imaging results that were available during my care of the patient were reviewed by me and considered in my medical decision making (see chart for details).     Patient does not have any pain on examination.  He is given treatment for volume depletion and antiemetics for home.  The patient has been able tolerate fluids here in the emergency department.  Patient is feeling better.  Told to return here for any worsening in his condition.  The patient agrees the plan and all questions were answered.  His vital signs have remained stable while here in the emergency department  Final Clinical Impressions(s) / ED Diagnoses   Final diagnoses:  Periumbilical abdominal pain    New Prescriptions Discharge Medication List as of 05/10/2017 11:35 AM    START taking these medications   Details  promethazine (PHENERGAN) 25 MG tablet Take 1 tablet (25 mg total) by mouth every 6 (six) hours as needed for nausea or vomiting., Starting Sat 05/10/2017, Print         Hasini Peachey, Ashley, PA-C 05/17/17 1421    Samuel Jester, DO 05/18/17 1300

## 2019-03-07 ENCOUNTER — Ambulatory Visit (HOSPITAL_COMMUNITY)
Admission: EM | Admit: 2019-03-07 | Discharge: 2019-03-07 | Disposition: A | Discharge status: Home / Self Care | Payer: 59 | Attending: Family Medicine | Admitting: Family Medicine

## 2019-03-07 ENCOUNTER — Encounter (HOSPITAL_COMMUNITY): Payer: Self-pay | Admitting: Emergency Medicine

## 2019-03-07 ENCOUNTER — Other Ambulatory Visit: Payer: Self-pay

## 2019-03-07 DIAGNOSIS — R1111 Vomiting without nausea: Secondary | ICD-10-CM

## 2019-03-07 MED ORDER — ONDANSETRON 4 MG PO TBDP
ORAL_TABLET | ORAL | Status: AC
  Filled 2019-03-07: qty 1

## 2019-03-07 MED ORDER — ONDANSETRON HCL 4 MG PO TABS: 4.0000 mg | ORAL_TABLET | Freq: Three times a day (TID) | ORAL | 0 refills | Status: DC | PRN

## 2019-03-07 MED ORDER — ONDANSETRON 4 MG PO TBDP
4.0000 mg | ORAL_TABLET | Freq: Once | ORAL | Status: AC
Start: 2019-03-07 — End: 2019-03-07
  Administered 2019-03-07: 4 mg via ORAL

## 2019-03-07 NOTE — ED Notes (Signed)
Pt was given zofran at 1:15 and was given ginger ale and told to watch for 30 mins and if able to keep down he can be discharged.

## 2019-03-07 NOTE — Discharge Instructions (Addendum)
Take Zofran for nausea and vomiting Allow this medicine to work for least 30 minutes.  Then try sips of water and clear liquids When you can keep down clear liquids you can add bland diet Call your doctor if not feeling better in a couple of days

## 2019-03-07 NOTE — ED Triage Notes (Signed)
Per pt he woke up Saturday morning vomiting. Pt said no abdominal pain only when he throws up. No nausea. No fevers no chills

## 2019-03-07 NOTE — ED Provider Notes (Signed)
[facility]    CSN: [id number] Arrival date & time: 03/07/19  1222     History   Chief Complaint Chief Complaint  Patient presents with  . Emesis    HPI Scott Riggs is a 43 y.o. male.   HPI Patient states he felt well Friday.  Had a normal diet.  Did not eat out.  He had lasagna for dinner.  He made the lasagna.  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  He states he woke up in the middle of the night Friday night/Saturday and had heartburn.  And then when he got out of bed Saturday morning he had vomiting.  He states he has some abdominal pain with the vomiting.  No abdominal pain in between vomiting spells.  No diarrhea.  No fever or chills.  No blood in emesis. No recent ibuprofen or aspirin use. Patient states he drinks 1 beer a day. No sick contacts History reviewed. No pertinent past medical history.  Patient Active Problem List   Diagnosis Date Noted  . ONYCHOMYCOSIS 05/08/2010  . OBESITY 05/08/2010  . DENTAL CARIES 08/17/2009  . ABSCESS, TOOTH 08/17/2009    History reviewed. No pertinent surgical history.     Home Medications    Prior to Admission medications   Medication Sig Start Date End Date Taking? Authorizing Provider  ondansetron (ZOFRAN) 4 MG tablet Take 1-2 tablets (4-8 mg total) by mouth every 8 (eight) hours as needed for nausea or vomiting. 03/07/19   Eustace MooreNelson, Yvonne Sue, MD    Family History Family History  Problem Relation Age of Onset  . Hypertension Mother     Social History Social History   Tobacco Use  . Smoking status: Current Every Day Smoker    Packs/day: 1.00    Types: Cigarettes  . Smokeless tobacco: Never Used  Substance Use Topics  . Alcohol use: Yes    Alcohol/week: 20.0 standard drinks    Types: 20 Cans of beer per week    Comment: "40 oz/day most days"  . Drug use: Yes    Frequency: 3.0 times per week    Types: Marijuana     Allergies   Patient has no known allergies.   Review of Systems  Review of Systems  Constitutional: Negative for chills and fever.  HENT: Negative for ear pain and sore throat.   Eyes: Negative for pain and visual disturbance.  Respiratory: Negative for cough and shortness of breath.   Cardiovascular: Negative for chest pain and palpitations.  Gastrointestinal: Positive for abdominal pain and vomiting.  Genitourinary: Negative for dysuria and hematuria.  Musculoskeletal: Negative for arthralgias and back pain.  Skin: Negative for color change and rash.  Neurological: Negative for seizures and syncope.  All other systems reviewed and are negative.    Physical Exam Triage Vital Signs ED Triage Vitals [03/07/19 1304]  Enc Vitals Group     BP (!) 139/97     Pulse Rate 60     Resp 16     Temp 98.4 F (36.9 C)     Temp Source Oral     SpO2 98 %   No data found.  Updated Vital Signs BP (!) 139/97 (BP Location: Right Arm)   Pulse 60   Temp 98.4 F (36.9 C) (Oral)   Resp 16   SpO2 98%   Visual Acuity Right Eye Distance:   Left Eye Distance:   Bilateral Distance:    Right Eye Near:   Left  Eye Near:    Bilateral Near:     Physical Exam Constitutional:      General: He is not in acute distress.    Appearance: He is well-developed and normal weight. He is not ill-appearing or toxic-appearing.  HENT:     Head: Normocephalic and atraumatic.     Mouth/Throat:     Mouth: Mucous membranes are moist.     Pharynx: No posterior oropharyngeal erythema.  Eyes:     Conjunctiva/sclera: Conjunctivae normal.     Pupils: Pupils are equal, round, and reactive to light.  Neck:     Musculoskeletal: Normal range of motion.  Cardiovascular:     Rate and Rhythm: Normal rate and regular rhythm.     Heart sounds: Normal heart sounds.  Pulmonary:     Effort: Pulmonary effort is normal. No respiratory distress.     Breath sounds: Normal breath sounds.  Abdominal:     General: There is no distension.     Palpations: Abdomen is soft.     Tenderness:  There is no abdominal tenderness.     Comments: Abdomen soft, normoactive bowel sounds  Musculoskeletal: Normal range of motion.  Lymphadenopathy:     Cervical: No cervical adenopathy.  Skin:    General: Skin is warm and dry.  Neurological:     Mental Status: He is alert.  Psychiatric:        Mood and Affect: Mood normal.        Behavior: Behavior normal.      UC Treatments / Results  Labs (all labs ordered are listed, but only abnormal results are displayed) Labs Reviewed - No data to display  EKG None  Radiology No results found.  Procedures Procedures (including critical care time)  Medications Ordered in UC Medications  ondansetron (ZOFRAN-ODT) disintegrating tablet 4 mg (4 mg Oral Given 03/07/19 1314)    Initial Impression / Assessment and Plan / UC Course  I have reviewed the triage vital signs and the nursing notes.  Pertinent labs & imaging results that were available during my care of the patient were reviewed by me and considered in my medical decision making (see chart for details).     Likely viral gastroenteritis.  Discussed with patient that occasionally COVID-19 will present with gastroenteritis.  He is advised that if he starts having fever chills body aches or worsening symptoms that he needs to callHis primary care doctor, the urgent care, or the  number for advice Final Clinical Impressions(s) / UC Diagnoses   Final diagnoses:  Intractable vomiting without nausea, unspecified vomiting type     Discharge Instructions     Take Zofran for nausea and vomiting Allow this medicine to work for least 30 minutes.  Then try sips of water and clear liquids When you can keep down clear liquids you can add bland diet Call your doctor if not feeling better in a couple of days    ED Prescriptions    Medication Sig Dispense Auth. Provider   ondansetron (ZOFRAN) 4 MG tablet Take 1-2 tablets (4-8 mg total) by mouth every 8 (eight) hours as needed  for nausea or vomiting. 12 tablet Raylene Everts, MD     Controlled Substance Prescriptions [place] Controlled Substance Registry consulted? Not Applicable   Raylene Everts, MD 03/07/19 1346

## 2020-02-10 ENCOUNTER — Other Ambulatory Visit: Payer: Self-pay

## 2020-02-10 ENCOUNTER — Ambulatory Visit (HOSPITAL_COMMUNITY)
Admission: EM | Admit: 2020-02-10 | Discharge: 2020-02-10 | Disposition: A | Discharge status: Home / Self Care | Payer: 59 | Attending: Family Medicine | Admitting: Family Medicine

## 2020-02-10 ENCOUNTER — Encounter (HOSPITAL_COMMUNITY): Payer: Self-pay

## 2020-02-10 DIAGNOSIS — R1010 Upper abdominal pain, unspecified: Secondary | ICD-10-CM | POA: Diagnosis not present

## 2020-02-10 DIAGNOSIS — R112 Nausea with vomiting, unspecified: Secondary | ICD-10-CM | POA: Diagnosis not present

## 2020-02-10 DIAGNOSIS — F17210 Nicotine dependence, cigarettes, uncomplicated: Secondary | ICD-10-CM | POA: Diagnosis not present

## 2020-02-10 DIAGNOSIS — F1290 Cannabis use, unspecified, uncomplicated: Secondary | ICD-10-CM | POA: Insufficient documentation

## 2020-02-10 DIAGNOSIS — Z20822 Contact with and (suspected) exposure to covid-19: Secondary | ICD-10-CM | POA: Insufficient documentation

## 2020-02-10 DIAGNOSIS — Z79899 Other long term (current) drug therapy: Secondary | ICD-10-CM | POA: Insufficient documentation

## 2020-02-10 MED ORDER — ONDANSETRON 4 MG PO TBDP
ORAL_TABLET | ORAL | Status: AC
  Filled 2020-02-10: qty 1

## 2020-02-10 MED ORDER — ONDANSETRON 4 MG PO TBDP
4.0000 mg | ORAL_TABLET | Freq: Once | ORAL | Status: AC
  Administered 2020-02-10: 4 mg via ORAL

## 2020-02-10 MED ORDER — ONDANSETRON 8 MG PO TBDP: 8.0000 mg | ORAL_TABLET | Freq: Three times a day (TID) | ORAL | 0 refills | Status: AC | PRN

## 2020-02-10 MED ORDER — ONDANSETRON HCL 4 MG/2ML IJ SOLN
INTRAMUSCULAR | Status: AC
  Filled 2020-02-10: qty 2

## 2020-02-10 MED ORDER — ONDANSETRON HCL 4 MG/2ML IJ SOLN
4.0000 mg | Freq: Once | INTRAMUSCULAR | Status: AC
  Administered 2020-02-10: 4 mg via INTRAMUSCULAR

## 2020-02-10 NOTE — ED Triage Notes (Signed)
Patient c/o emesis w/ upper abdominal pain.. Denies cough, sore throat, ear pain, and shortness of breath.

## 2020-02-10 NOTE — Discharge Instructions (Signed)
Really consider cutting back or quitting marijuana use all together to avoid cannabis hyperemesis syndrome. Make sure you push fluids drinking mostly water but mix it with Gatorade.  Try to eat light meals including soups, broths and soft foods, fruits.  You may use Zofran for your nausea and vomiting once every 8 hours.  Please return to the clinic if symptoms worsen or you start having severe abdominal pain not helped by taking Tylenol or start having bloody stools or blood in the vomit.

## 2020-02-10 NOTE — ED Provider Notes (Signed)
[facility]   MRN: [id number] DOB: Feb 14, 1976  Subjective:   Scott Riggs is a 44 y.o. male presenting for sore throat 2 days ago (now resolved), 1 day history of acute onset nausea with vomiting.  Patient has has not been able to tolerate food or liquids. Smokes marijuana daily.  No recent antibiotic use or hospitalizations.  No current facility-administered medications for this encounter.  Current Outpatient Medications:  .  ondansetron (ZOFRAN) 4 MG tablet, Take 1-2 tablets (4-8 mg total) by mouth every 8 (eight) hours as needed for nausea or vomiting., Disp: 12 tablet, Rfl: 0   No Known Allergies  History reviewed. No pertinent past medical history.   History reviewed. No pertinent surgical history.  Family History  Problem Relation Age of Onset  . Hypertension Mother     Social History   Tobacco Use  . Smoking status: Current Every Day Smoker    Packs/day: 1.00    Types: Cigarettes  . Smokeless tobacco: Never Used  Substance Use Topics  . Alcohol use: Yes    Alcohol/week: 20.0 standard drinks    Types: 20 Cans of beer per week    Comment: "40 oz/day most days"  . Drug use: Yes    Frequency: 3.0 times per week    Types: Marijuana    Review of Systems  Constitutional: Negative for fever and malaise/fatigue.  HENT: Positive for sore throat. Negative for congestion, ear pain and sinus pain.   Eyes: Negative for discharge and redness.  Respiratory: Negative for cough, hemoptysis, shortness of breath and wheezing.   Cardiovascular: Negative for chest pain.  Gastrointestinal: Positive for abdominal pain (upper), nausea and vomiting. Negative for blood in stool, constipation and diarrhea.  Genitourinary: Negative for dysuria, flank pain and hematuria.  Musculoskeletal: Negative for myalgias.  Skin: Negative for rash.  Neurological: Negative for dizziness, weakness and headaches.  Psychiatric/Behavioral: Negative for depression and substance abuse.     Objective:   Vitals: BP 125/82 (BP Location: Left Arm)   Pulse (!) 50   Temp 98.7 F (37.1 C) (Oral)   Resp 16   SpO2 96%   Physical Exam Constitutional:      General: He is not in acute distress.    Appearance: Normal appearance. He is well-developed. He is not ill-appearing, toxic-appearing or diaphoretic.  HENT:     Head: Normocephalic and atraumatic.     Right Ear: External ear normal.     Left Ear: External ear normal.     Nose: Nose normal.     Mouth/Throat:     Mouth: Mucous membranes are moist.     Pharynx: Oropharynx is clear. No oropharyngeal exudate or posterior oropharyngeal erythema.  Eyes:     General: No scleral icterus.       Right eye: No discharge.        Left eye: No discharge.     Extraocular Movements: Extraocular movements intact.     Pupils: Pupils are equal, round, and reactive to light.  Cardiovascular:     Rate and Rhythm: Normal rate and regular rhythm.     Heart sounds: Normal heart sounds. No murmur. No friction rub. No gallop.   Pulmonary:     Effort: Pulmonary effort is normal. No respiratory distress.     Breath sounds: Normal breath sounds. No stridor. No wheezing, rhonchi or rales.  Abdominal:     General: Bowel sounds are normal. There is no distension.     Palpations: Abdomen is soft. There is  no mass.     Tenderness: There is abdominal tenderness (mild, upper). There is no guarding or rebound.  Skin:    General: Skin is warm and dry.  Neurological:     Mental Status: He is alert and oriented to person, place, and time.  Psychiatric:        Mood and Affect: Mood normal.        Behavior: Behavior normal.        Thought Content: Thought content normal.        Judgment: Judgment normal.      Assessment and Plan :   PDMP not reviewed this encounter.  1. Nausea and vomiting, intractability of vomiting not specified, unspecified vomiting type   2. Cannabinoid hyperemesis syndrome   3. Pain of upper abdomen     P.o. Zofran  given in clinic which she vomited.  Patient was subsequently given 4 mg IM Zofran.  Counseled on nature of cannabinoid hyperemesis syndrome.  Recommended pushing fluids at home, scheduling Zofran ODT.  Recommended smoking cessation. COVID 19 testing pending. Counseled patient on potential for adverse effects with medications prescribed/recommended today, ER and return-to-clinic precautions discussed, patient verbalized understanding.    Wallis Bamberg, PA-C 02/10/20 1148

## 2020-02-11 LAB — SARS CORONAVIRUS 2 (TAT 6-24 HRS): SARS Coronavirus 2: NEGATIVE

## 2020-02-12 ENCOUNTER — Emergency Department (HOSPITAL_COMMUNITY)
Admission: EM | Admit: 2020-02-12 | Discharge: 2020-02-13 | Disposition: A | Discharge status: Home / Self Care | Payer: 59 | Attending: Emergency Medicine | Admitting: Emergency Medicine

## 2020-02-12 ENCOUNTER — Encounter (HOSPITAL_COMMUNITY): Payer: Self-pay | Admitting: Emergency Medicine

## 2020-02-12 DIAGNOSIS — R112 Nausea with vomiting, unspecified: Secondary | ICD-10-CM | POA: Diagnosis present

## 2020-02-12 DIAGNOSIS — Z79899 Other long term (current) drug therapy: Secondary | ICD-10-CM | POA: Diagnosis not present

## 2020-02-12 DIAGNOSIS — R195 Other fecal abnormalities: Secondary | ICD-10-CM | POA: Insufficient documentation

## 2020-02-12 DIAGNOSIS — R1115 Cyclical vomiting syndrome unrelated to migraine: Secondary | ICD-10-CM | POA: Diagnosis not present

## 2020-02-12 DIAGNOSIS — F17210 Nicotine dependence, cigarettes, uncomplicated: Secondary | ICD-10-CM | POA: Insufficient documentation

## 2020-02-12 LAB — URINALYSIS, ROUTINE W REFLEX MICROSCOPIC
Bacteria, UA: NONE SEEN
Bilirubin Urine: NEGATIVE
Glucose, UA: NEGATIVE mg/dL
Hgb urine dipstick: NEGATIVE
Ketones, ur: 20 mg/dL — AB
Leukocytes,Ua: NEGATIVE
Nitrite: NEGATIVE
Protein, ur: 100 mg/dL — AB
Specific Gravity, Urine: 1.031 — ABNORMAL HIGH (ref 1.005–1.030)
pH: 6 (ref 5.0–8.0)

## 2020-02-12 LAB — COMPREHENSIVE METABOLIC PANEL
ALT: 23 U/L (ref 0–44)
AST: 23 U/L (ref 15–41)
Albumin: 4.2 g/dL (ref 3.5–5.0)
Alkaline Phosphatase: 65 U/L (ref 38–126)
Anion gap: 15 (ref 5–15)
BUN: 22 mg/dL — ABNORMAL HIGH (ref 6–20)
CO2: 27 mmol/L (ref 22–32)
Calcium: 10.2 mg/dL (ref 8.9–10.3)
Chloride: 96 mmol/L — ABNORMAL LOW (ref 98–111)
Creatinine, Ser: 1.4 mg/dL — ABNORMAL HIGH (ref 0.61–1.24)
GFR calc Af Amer: >60 mL/min (ref >60)
GFR calc non Af Amer: >60 mL/min (ref >60)
Glucose, Bld: 124 mg/dL — ABNORMAL HIGH (ref 70–99)
Potassium: 3.9 mmol/L (ref 3.5–5.1)
Sodium: 138 mmol/L (ref 135–145)
Total Bilirubin: 1.8 mg/dL — ABNORMAL HIGH (ref 0.3–1.2)
Total Protein: 7.1 g/dL (ref 6.5–8.1)

## 2020-02-12 LAB — LIPASE, BLOOD: Lipase: 32 U/L (ref 11–51)

## 2020-02-12 LAB — CBC
HCT: 50.8 % (ref 39.0–52.0)
Hemoglobin: 18.3 g/dL — ABNORMAL HIGH (ref 13.0–17.0)
MCH: 33.1 pg (ref 26.0–34.0)
MCHC: 36 g/dL (ref 30.0–36.0)
MCV: 91.9 fL (ref 80.0–100.0)
Platelets: 289 10*3/uL (ref 150–400)
RBC: 5.53 MIL/uL (ref 4.22–5.81)
RDW: 12.5 % (ref 11.5–15.5)
WBC: 12.3 10*3/uL — ABNORMAL HIGH (ref 4.0–10.5)
nRBC: 0 % (ref 0.0–0.2)

## 2020-02-12 LAB — POC OCCULT BLOOD, ED: Fecal Occult Bld: POSITIVE — AB

## 2020-02-12 MED ORDER — CAPSAICIN 0.025 % EX CREA
TOPICAL_CREAM | Freq: Once | CUTANEOUS | Status: AC
  Filled 2020-02-12: qty 60

## 2020-02-12 MED ORDER — SODIUM CHLORIDE 0.9% FLUSH: 3.0000 mL | Freq: Once | INTRAVENOUS | Status: DC

## 2020-02-12 MED ORDER — SODIUM CHLORIDE 0.9 % IV BOLUS
1000.0000 mL | Freq: Once | INTRAVENOUS | Status: AC
Start: 2020-02-12 — End: 2020-02-13
  Administered 2020-02-12: 1000 mL via INTRAVENOUS

## 2020-02-12 MED ORDER — HALOPERIDOL LACTATE 5 MG/ML IJ SOLN
5.0000 mg | Freq: Once | INTRAMUSCULAR | Status: AC
  Administered 2020-02-12: 5 mg via INTRAVENOUS
  Filled 2020-02-12: qty 1

## 2020-02-12 MED ORDER — KETOROLAC TROMETHAMINE 60 MG/2ML IM SOLN: 30.0000 mg | Freq: Once | INTRAMUSCULAR | Status: DC

## 2020-02-12 MED ORDER — ONDANSETRON HCL 4 MG PO TABS: 4.0000 mg | ORAL_TABLET | Freq: Three times a day (TID) | ORAL | 0 refills | Status: DC | PRN

## 2020-02-12 MED ORDER — ONDANSETRON HCL 4 MG/2ML IJ SOLN
4.0000 mg | Freq: Once | INTRAMUSCULAR | Status: AC
  Administered 2020-02-12: 4 mg via INTRAVENOUS
  Filled 2020-02-12: qty 2

## 2020-02-12 NOTE — ED Triage Notes (Signed)
Pt reports gen abd pain and vomiting since wed, seen at urgent care and given nausea meds that are no longer working.

## 2020-02-12 NOTE — ED Provider Notes (Signed)
[facility] EMERGENCY DEPARTMENT Provider Note   CSN: [id number] Arrival date & time: 02/12/20  1134     History Chief Complaint  Patient presents with  . Abdominal Pain  . Emesis    Scott Riggs is a 44 y.o. 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  Was seen at urgent care and given Zofran ODT, patient states that he is out of it and it really has not been helping.  Patient has been vomiting constantly, states that he is vomited more than 10 times yesterday.  Denies any hemoptysis, coffee-ground emesis.  Patient states that he has not been able to keep anything down including fluids due to his pain and vomiting.  Patient denies any diarrhea, constipation, hematochezia, admits to some tarry stools that have been occurring for the past 3 days.  Had bowel movement this AM. Denies any chest pain, shortness of breath, back pain, neck pain, fevers, chills, sick contacts.  Patient states that he think he ate a bad slice of pizza that had been a couple days old when the pain started.  Patient does smoke marijuana daily, no IV drug use or alcohol use.  Does not take any medications daily.  Has not tried anything for this besides the Zofran.  States that hot showers and rest make him feel better.  HPI     History reviewed. No pertinent past medical history.  Patient Active Problem List   Diagnosis Date Noted  . ONYCHOMYCOSIS 05/08/2010  . OBESITY 05/08/2010  . DENTAL CARIES 08/17/2009  . ABSCESS, TOOTH 08/17/2009    History reviewed. No pertinent surgical history.     Family History  Problem Relation Age of Onset  . Hypertension Mother     Social History   Tobacco Use  . Smoking status: Current Every Day Smoker    Packs/day: 1.00    Types: Cigarettes  . Smokeless tobacco: Never Used  Substance Use Topics  . Alcohol use: Yes   Alcohol/week: 20.0 standard drinks    Types: 20 Cans of beer per week    Comment: "40 oz/day most days"  . Drug use: Yes    Frequency: 3.0 times per week    Types: Marijuana    Home Medications Prior to Admission medications   Medication Sig Start Date End Date Taking? Authorizing Provider  ondansetron (ZOFRAN-ODT) 8 MG disintegrating tablet Take 1 tablet (8 mg total) by mouth every 8 (eight) hours as needed for nausea or vomiting. 02/10/20  Yes Wallis Bamberg, PA-C  ondansetron (ZOFRAN) 4 MG tablet Take 1-2 tablets (4-8 mg total) by mouth every 8 (eight) hours as needed for nausea or vomiting. 02/12/20   Fayrene Helper, PA-C    Allergies    Patient has no known allergies.  Review of Systems   Review of Systems  Constitutional: Negative for chills, diaphoresis, fatigue and fever.  HENT: Negative for congestion, sore throat and trouble swallowing.   Eyes: Negative for pain and visual disturbance.  Respiratory: Negative for cough, shortness of breath and wheezing.   Cardiovascular: Negative for chest pain, palpitations and leg swelling.  Gastrointestinal: Positive for abdominal pain, nausea and vomiting. Negative for abdominal distention, constipation, diarrhea and rectal pain.  Genitourinary: Negative for difficulty urinating.  Musculoskeletal: Negative for back pain, neck pain and neck stiffness.  Skin: Negative for pallor.  Neurological: Negative for dizziness, speech difficulty, weakness and headaches.  Psychiatric/Behavioral:  Negative for confusion.    Physical Exam Updated Vital Signs BP 135/90 (BP Location: Left Arm)   Pulse 81   Temp 98.3 F (36.8 C) (Oral)   Resp 18   Ht 5\' 10"  (1.778 m)   Wt 86.2 kg   SpO2 100%   BMI 27.26 kg/m   Physical Exam Exam conducted with a chaperone present.  Constitutional:      General: He is not in acute distress.    Appearance: Normal appearance. He is not ill-appearing, toxic-appearing or diaphoretic.  HENT:     Mouth/Throat:      Mouth: Mucous membranes are moist.     Pharynx: Oropharynx is clear.  Eyes:     General: No scleral icterus.    Extraocular Movements: Extraocular movements intact.     Pupils: Pupils are equal, round, and reactive to light.  Cardiovascular:     Rate and Rhythm: Normal rate and regular rhythm.     Pulses: Normal pulses.     Heart sounds: Normal heart sounds.  Pulmonary:     Effort: Pulmonary effort is normal. No respiratory distress.     Breath sounds: Normal breath sounds. No stridor. No wheezing, rhonchi or rales.  Chest:     Chest wall: No tenderness.  Abdominal:     General: Abdomen is flat. Bowel sounds are normal. There is no distension.     Palpations: Abdomen is soft.     Tenderness: There is abdominal tenderness (Mild, not tender to palpation. Tender when he vomits). There is no right CVA tenderness, left CVA tenderness, guarding or rebound.  Genitourinary:    Rectum: Normal. No tenderness or external hemorrhoid. Normal anal tone.     Comments: Brown stool, normal rectal tone.  No tarry stools noted. Musculoskeletal:        General: No swelling or tenderness. Normal range of motion.     Cervical back: Normal range of motion and neck supple. No rigidity.     Right lower leg: No edema.     Left lower leg: No edema.  Skin:    General: Skin is warm and dry.     Capillary Refill: Capillary refill takes less than 2 seconds.     Coloration: Skin is not pale.  Neurological:     General: No focal deficit present.     Mental Status: He is alert and oriented to person, place, and time. Mental status is at baseline.     Cranial Nerves: No cranial nerve deficit.     Coordination: Coordination normal.  Psychiatric:        Mood and Affect: Mood normal.        Behavior: Behavior normal.     ED Results / Procedures / Treatments   Labs (all labs ordered are listed, but only abnormal results are displayed) Labs Reviewed  COMPREHENSIVE METABOLIC PANEL - Abnormal; Notable for the  following components:      Result Value   Chloride 96 (*)    Glucose, Bld 124 (*)    BUN 22 (*)    Creatinine, Ser 1.40 (*)    Total Bilirubin 1.8 (*)    All other components within normal limits  CBC - Abnormal; Notable for the following components:   WBC 12.3 (*)    Hemoglobin 18.3 (*)    All other components within normal limits  URINALYSIS, ROUTINE W REFLEX MICROSCOPIC - Abnormal; Notable for the following components:   Specific Gravity, Urine 1.031 (*)    Ketones, ur 20 (*)  Protein, ur 100 (*)    All other components within normal limits  POC OCCULT BLOOD, ED - Abnormal; Notable for the following components:   Fecal Occult Bld POSITIVE (*)    All other components within normal limits  LIPASE, BLOOD    EKG EKG Interpretation  Date/Time:  Saturday Feb 12 2020 14:07:43 EDT Ventricular Rate:  59 PR Interval:    QRS Duration: 88 QT Interval:  401 QTC Calculation: 398 R Axis:   67 Text Interpretation: Sinus rhythm Probable left atrial enlargement Borderline repolarization abnormality Baseline wander in lead(s) V3 V6 Confirmed by Dory Horn) on 02/13/2020 12:41:47 PM   Radiology No results found.  Procedures Procedures (including critical care time)  Medications Ordered in ED Medications  sodium chloride 0.9 % bolus 1,000 mL (0 mLs Intravenous Stopped 02/13/20 0736)  capsaicin (ZOSTRIX) 0.025 % cream ( Topical Given 02/12/20 1545)  ondansetron (ZOFRAN) injection 4 mg (4 mg Intravenous Given 02/12/20 1443)  haloperidol lactate (HALDOL) injection 5 mg (5 mg Intravenous Given 02/12/20 1546)    ED Course  I have reviewed the triage vital signs and the nursing notes.  Pertinent labs & imaging results that were available during my care of the patient were reviewed by me and considered in my medical decision making (see chart for details).    MDM Rules/Calculators/A&P                     Scott Riggs is a 44 y.o. male with pertinent past medical history of  hyperemesis cannabinoid syndrome that presents emergency department today for nausea and abdominal pain that started 3 days ago. Most likely due to marijuana. Pt is not tender to palpation on abdomen.Hemocault +, Hgb normal, will have pt follow up with GI about this. Do not think we need CT at this time. Pt still feels nausous with Zofran and Haldol on board. All other labs reassuring - CBC with leukocytosis of 12.3, most likely reactive there are no other sings of infection, normal vitals. CMP consistent with dehydration - told pt about increased Creatinine and need to follow with PCP, pt agreeable. Negative lipase. . Pt has not once vomited while in the ED.  Pt care was handed off to [employer] [facility] at 4:04  Complete history and physical and current plan have been communicated.  Please refer to their note for the remainder of ED care and ultimate disposition. Awaiting capcaisin cream and PO challenge once pt feels better. Awaiting urine results.   This chart was discussed with Dr. Sherry Ruffing who agreed with the care and disposition of the patient.   Final Clinical Impression(s) / ED Diagnoses Final diagnoses:  Cyclical vomiting  Fecal occult blood test positive    Rx / DC Orders ED Discharge Orders         Ordered    ondansetron (ZOFRAN) 4 MG tablet  Every 8 hours PRN     02/12/20 1804           Alfredia Client, PA-C 02/13/20 1727    Tegeler, Gwenyth Allegra, MD 02/13/20 2222

## 2020-02-12 NOTE — ED Notes (Signed)
Ask Patient for urine sample patient stated he did not need to urinate at this time.
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
## 2020-02-12 NOTE — Discharge Instructions (Addendum)
You were seen today for nausea and vomiting.  This could be due to a viral stomach bug, this could also be due to marijuana use.  Your blood work looks assuring.  Your Hemoccult test was positive and I want you to follow-up with GI.  I attached a guide about gastroenteritis that you can read.  I want you to get as much fluids and and rest if possible.  Come back to the emergency department if you start having any worsening symptoms.

## 2020-05-28 ENCOUNTER — Other Ambulatory Visit: Payer: Self-pay

## 2020-05-28 ENCOUNTER — Encounter (HOSPITAL_COMMUNITY): Payer: Self-pay | Admitting: *Deleted

## 2020-05-28 ENCOUNTER — Emergency Department (HOSPITAL_COMMUNITY)
Admission: EM | Admit: 2020-05-28 | Discharge: 2020-05-28 | Disposition: A | Discharge status: Home / Self Care | Payer: 59 | Attending: Emergency Medicine | Admitting: Emergency Medicine

## 2020-05-28 DIAGNOSIS — Z5321 Procedure and treatment not carried out due to patient leaving prior to being seen by health care provider: Secondary | ICD-10-CM | POA: Insufficient documentation

## 2020-05-28 DIAGNOSIS — R1110 Vomiting, unspecified: Secondary | ICD-10-CM | POA: Diagnosis not present

## 2020-05-28 LAB — COMPREHENSIVE METABOLIC PANEL
ALT: 24 U/L (ref 0–44)
AST: 21 U/L (ref 15–41)
Albumin: 4 g/dL (ref 3.5–5.0)
Alkaline Phosphatase: 65 U/L (ref 38–126)
Anion gap: 12 (ref 5–15)
BUN: 17 mg/dL (ref 6–20)
CO2: 20 mmol/L — ABNORMAL LOW (ref 22–32)
Calcium: 9.5 mg/dL (ref 8.9–10.3)
Chloride: 100 mmol/L (ref 98–111)
Creatinine, Ser: 1.25 mg/dL — ABNORMAL HIGH (ref 0.61–1.24)
GFR calc Af Amer: >60 mL/min (ref >60)
GFR calc non Af Amer: >60 mL/min (ref >60)
Glucose, Bld: 110 mg/dL — ABNORMAL HIGH (ref 70–99)
Potassium: 3.7 mmol/L (ref 3.5–5.1)
Sodium: 132 mmol/L — ABNORMAL LOW (ref 135–145)
Total Bilirubin: 1.9 mg/dL — ABNORMAL HIGH (ref 0.3–1.2)
Total Protein: 6.9 g/dL (ref 6.5–8.1)

## 2020-05-28 LAB — URINALYSIS, ROUTINE W REFLEX MICROSCOPIC
Bacteria, UA: NONE SEEN
Glucose, UA: NEGATIVE mg/dL
Hgb urine dipstick: NEGATIVE
Ketones, ur: 20 mg/dL — AB
Leukocytes,Ua: NEGATIVE
Nitrite: NEGATIVE
Protein, ur: 100 mg/dL — AB
Specific Gravity, Urine: 1.03 (ref 1.005–1.030)
pH: 5 (ref 5.0–8.0)

## 2020-05-28 LAB — CBC
HCT: 50.7 % (ref 39.0–52.0)
Hemoglobin: 18.1 g/dL — ABNORMAL HIGH (ref 13.0–17.0)
MCH: 32.5 pg (ref 26.0–34.0)
MCHC: 35.7 g/dL (ref 30.0–36.0)
MCV: 91 fL (ref 80.0–100.0)
Platelets: 211 10*3/uL (ref 150–400)
RBC: 5.57 MIL/uL (ref 4.22–5.81)
RDW: 13.1 % (ref 11.5–15.5)
WBC: 6.1 10*3/uL (ref 4.0–10.5)
nRBC: 0 % (ref 0.0–0.2)

## 2020-05-28 LAB — LIPASE, BLOOD: Lipase: 38 U/L (ref 11–51)

## 2020-05-28 MED ORDER — ONDANSETRON 4 MG PO TBDP
4.0000 mg | ORAL_TABLET | Freq: Once | ORAL | Status: AC | PRN
  Administered 2020-05-28: 4 mg via ORAL
  Filled 2020-05-28: qty 1

## 2020-05-28 NOTE — ED Triage Notes (Signed)
Pt states vomiting since Thursday. Last marijuana use Thursday. No diarrhea, denies pain.

## 2020-05-28 NOTE — ED Notes (Signed)
Pt left while waiting in lobby.

## 2020-10-03 ENCOUNTER — Encounter (HOSPITAL_COMMUNITY): Payer: Self-pay

## 2020-10-03 ENCOUNTER — Other Ambulatory Visit: Payer: Self-pay

## 2020-10-03 ENCOUNTER — Emergency Department (HOSPITAL_COMMUNITY)
Admission: EM | Admit: 2020-10-03 | Discharge: 2020-10-03 | Disposition: A | Discharge status: Home / Self Care | Payer: 59 | Attending: Emergency Medicine | Admitting: Emergency Medicine

## 2020-10-03 DIAGNOSIS — U071 COVID-19: Secondary | ICD-10-CM | POA: Diagnosis not present

## 2020-10-03 DIAGNOSIS — R112 Nausea with vomiting, unspecified: Secondary | ICD-10-CM

## 2020-10-03 DIAGNOSIS — F17210 Nicotine dependence, cigarettes, uncomplicated: Secondary | ICD-10-CM | POA: Diagnosis not present

## 2020-10-03 LAB — CBC
HCT: 43.7 % (ref 39.0–52.0)
Hemoglobin: 16 g/dL (ref 13.0–17.0)
MCH: 33.2 pg (ref 26.0–34.0)
MCHC: 36.6 g/dL — ABNORMAL HIGH (ref 30.0–36.0)
MCV: 90.7 fL (ref 80.0–100.0)
Platelets: 128 10*3/uL — ABNORMAL LOW (ref 150–400)
RBC: 4.82 MIL/uL (ref 4.22–5.81)
RDW: 12.8 % (ref 11.5–15.5)
WBC: 5.1 10*3/uL (ref 4.0–10.5)
nRBC: 0 % (ref 0.0–0.2)

## 2020-10-03 LAB — URINALYSIS, ROUTINE W REFLEX MICROSCOPIC
Bacteria, UA: NONE SEEN
Bilirubin Urine: NEGATIVE
Glucose, UA: NEGATIVE mg/dL
Hgb urine dipstick: NEGATIVE
Ketones, ur: 20 mg/dL — AB
Leukocytes,Ua: NEGATIVE
Nitrite: NEGATIVE
Protein, ur: 100 mg/dL — AB
Specific Gravity, Urine: 1.032 — ABNORMAL HIGH (ref 1.005–1.030)
pH: 6 (ref 5.0–8.0)

## 2020-10-03 LAB — COMPREHENSIVE METABOLIC PANEL
ALT: 30 U/L (ref 0–44)
AST: 32 U/L (ref 15–41)
Albumin: 4 g/dL (ref 3.5–5.0)
Alkaline Phosphatase: 59 U/L (ref 38–126)
Anion gap: 16 — ABNORMAL HIGH (ref 5–15)
BUN: 21 mg/dL — ABNORMAL HIGH (ref 6–20)
CO2: 21 mmol/L — ABNORMAL LOW (ref 22–32)
Calcium: 9.9 mg/dL (ref 8.9–10.3)
Chloride: 101 mmol/L (ref 98–111)
Creatinine, Ser: 1.21 mg/dL (ref 0.61–1.24)
GFR, Estimated: >60 mL/min (ref >60)
Glucose, Bld: 137 mg/dL — ABNORMAL HIGH (ref 70–99)
Potassium: 3.6 mmol/L (ref 3.5–5.1)
Sodium: 138 mmol/L (ref 135–145)
Total Bilirubin: 1.1 mg/dL (ref 0.3–1.2)
Total Protein: 7.2 g/dL (ref 6.5–8.1)

## 2020-10-03 LAB — RAPID URINE DRUG SCREEN, HOSP PERFORMED
Amphetamines: NOT DETECTED
Barbiturates: NOT DETECTED
Benzodiazepines: NOT DETECTED
Cocaine: NOT DETECTED
Opiates: NOT DETECTED
Tetrahydrocannabinol: POSITIVE — AB

## 2020-10-03 LAB — RESP PANEL BY RT-PCR (FLU A&B, COVID) ARPGX2
Influenza A by PCR: NEGATIVE
Influenza B by PCR: NEGATIVE
SARS Coronavirus 2 by RT PCR: POSITIVE — AB

## 2020-10-03 LAB — LIPASE, BLOOD: Lipase: 29 U/L (ref 11–51)

## 2020-10-03 MED ORDER — ONDANSETRON HCL 4 MG PO TABS
4.0000 mg | ORAL_TABLET | Freq: Four times a day (QID) | ORAL | 0 refills | Status: AC
Start: 2020-10-03 — End: 2020-10-10

## 2020-10-03 MED ORDER — SODIUM CHLORIDE 0.9 % IV BOLUS
1000.0000 mL | Freq: Once | INTRAVENOUS | Status: AC
  Administered 2020-10-03: 1000 mL via INTRAVENOUS

## 2020-10-03 MED ORDER — ONDANSETRON HCL 4 MG/2ML IJ SOLN
4.0000 mg | Freq: Once | INTRAMUSCULAR | Status: AC
  Administered 2020-10-03: 4 mg via INTRAVENOUS
  Filled 2020-10-03: qty 2

## 2020-10-03 NOTE — ED Triage Notes (Signed)
Patient reports not being able to keep anything down since Saturday, reports abdominal pain with N/V/D, denies any sick contacts.

## 2020-10-03 NOTE — ED Notes (Signed)
Pt given d/c instructions. Pt agreeable to discharge and understands his instructions. E signature pad not working

## 2020-10-03 NOTE — Discharge Instructions (Addendum)
Your Covid 19 test was positive on today's visit.   Your laboratory results were within normal limits today.  You may continue taking Tylenol to help with fever, continue to hydrate with plenty of fluids.  I have written a prescription for nausea medication please take this as needed.  You will need to ultimately discontinue marijuana intake as this can worsen your condition.

## 2020-10-03 NOTE — ED Provider Notes (Signed)
[facility] EMERGENCY DEPARTMENT Provider Note   CSN: [id number] Arrival date & time: 10/03/20  0441     History Chief Complaint  Patient presents with  . Emesis    Scott Riggs is a 45 y.o. male.  45 y.o male with a PMH of hyperemesis cannaboid syndrome presents to the ED with a chief complaint of nausea and vomiting x 3 days. Last marijuana prior to symptoms beginning.  Previously seen in the ED for same complaint, was given a prescription for Zofran which he reports he had run out of.  He tried Pepto-Bismol to help with symptoms but they seem to worsen.  Has been able to tolerate liquids however has a immediate this is after this.  Witnessed 6 episodes of nonbilious, nonbloody emesis while in the waiting room.  No fever, diarrhea, domino pain, other complaints.No chest pain, no shortness of breath or hematemasis.   Prior COVID-19 immunizations, last bowel movement this morning.   The history is provided by the patient.  Emesis Severity:  Moderate Duration:  3 days Timing:  Constant Number of daily episodes:  >10 Able to tolerate:  Liquids How soon after eating does vomiting occur:  3 seconds Progression:  Worsening Chronicity:  Recurrent Recent urination:  Decreased Relieved by:  None tried Associated symptoms: abdominal pain   Associated symptoms: no diarrhea, no fever, no headaches and no sore throat        History reviewed. No pertinent past medical history.  Patient Active Problem List   Diagnosis Date Noted  . ONYCHOMYCOSIS 05/08/2010  . OBESITY 05/08/2010  . DENTAL CARIES 08/17/2009  . ABSCESS, TOOTH 08/17/2009    History reviewed. No pertinent surgical history.     Family History  Problem Relation Age of Onset  . Hypertension Mother     Social History   Tobacco Use  . Smoking status: Current Every Day Smoker    Packs/day: 1.00    Types: Cigarettes  . Smokeless tobacco: Never Used  Vaping Use  . Vaping Use: Never used   Substance Use Topics  . Alcohol use: Yes    Alcohol/week: 20.0 standard drinks    Types: 20 Cans of beer per week    Comment: "40 oz/day most days"  . Drug use: Yes    Frequency: 3.0 times per week    Types: Marijuana    Home Medications Prior to Admission medications   Medication Sig Start Date End Date Taking? Authorizing Provider  ondansetron (ZOFRAN) 4 MG tablet Take 1 tablet (4 mg total) by mouth every 6 (six) hours for 7 days. 10/03/20 10/10/20 Yes Gino Garrabrant, Leonie Douglas, PA-C  ondansetron (ZOFRAN-ODT) 8 MG disintegrating tablet Take 1 tablet (8 mg total) by mouth every 8 (eight) hours as needed for nausea or vomiting. 02/10/20   Wallis Bamberg, PA-C    Allergies    Patient has no known allergies.  Review of Systems   Review of Systems  Constitutional: Negative for fever.  HENT: Negative for sore throat.   Respiratory: Negative for shortness of breath.   Cardiovascular: Negative for chest pain.  Gastrointestinal: Positive for abdominal pain and vomiting. Negative for diarrhea.  Genitourinary: Negative for flank pain.  Neurological: Negative for light-headedness and headaches.  All other systems reviewed and are negative.   Physical Exam Updated Vital Signs BP 110/74   Pulse (!) 44   Temp 97.8 F (36.6 C) (Oral)   Resp (!) 26   Ht 5\' 11"  (1.803 m)   Wt 81.6 kg  SpO2 92%   BMI 25.10 kg/m   Physical Exam Vitals and nursing note reviewed.  Constitutional:      Appearance: Normal appearance. He is well-developed and well-nourished.  HENT:     Head: Normocephalic and atraumatic.     Mouth/Throat:     Mouth: Oropharynx is clear and moist.  Eyes:     General: No scleral icterus.    Pupils: Pupils are equal, round, and reactive to light.  Cardiovascular:     Heart sounds: Normal heart sounds.     Comments: No bilateral calf tenderness. Pulmonary:     Effort: Pulmonary effort is normal.     Breath sounds: Normal breath sounds. No wheezing.     Comments: Lungs are clear to  auscultation without any retraction. Chest:     Chest wall: No tenderness.  Abdominal:     General: Bowel sounds are normal. There is no distension.     Palpations: Abdomen is soft.     Tenderness: There is no abdominal tenderness.     Comments: Bowel sounds are present, abdomen is soft, nontender to palpation.  Musculoskeletal:        General: No tenderness or deformity.     Cervical back: Normal range of motion.  Skin:    General: Skin is warm and dry.  Neurological:     Mental Status: He is alert and oriented to person, place, and time.     Comments: Moves upper and lower extremities without any discomfort.      ED Results / Procedures / Treatments   Labs (all labs ordered are listed, but only abnormal results are displayed) Labs Reviewed  RESP PANEL BY RT-PCR (FLU A&B, COVID) ARPGX2 - Abnormal; Notable for the following components:      Result Value   SARS Coronavirus 2 by RT PCR POSITIVE (*)    All other components within normal limits  COMPREHENSIVE METABOLIC PANEL - Abnormal; Notable for the following components:   CO2 21 (*)    Glucose, Bld 137 (*)    BUN 21 (*)    Anion gap 16 (*)    All other components within normal limits  CBC - Abnormal; Notable for the following components:   MCHC 36.6 (*)    Platelets 128 (*)    All other components within normal limits  URINALYSIS, ROUTINE W REFLEX MICROSCOPIC - Abnormal; Notable for the following components:   Color, Urine AMBER (*)    APPearance HAZY (*)    Specific Gravity, Urine 1.032 (*)    Ketones, ur 20 (*)    Protein, ur 100 (*)    All other components within normal limits  RAPID URINE DRUG SCREEN, HOSP PERFORMED - Abnormal; Notable for the following components:   Tetrahydrocannabinol POSITIVE (*)    All other components within normal limits  LIPASE, BLOOD    EKG EKG Interpretation  Date/Time:  Tuesday October 03 2020 12:47:39 EST Ventricular Rate:  54 PR Interval:    QRS Duration: 93 QT  Interval:  428 QTC Calculation: 406 R Axis:   71 Text Interpretation: Sinus rhythm Consider left ventricular hypertrophy Nonspecific T abnormalities, anterior leads since last tracing no significant change Confirmed by Mancel Bale [phone]) on 10/03/2020 1:26:06 PM   Radiology No results found.  Procedures Procedures (including critical care time)  Medications Ordered in ED Medications  sodium chloride 0.9 % bolus 1,000 mL (0 mLs Intravenous Stopped 10/03/20 1420)  ondansetron (ZOFRAN) injection 4 mg (4 mg Intravenous Given 10/03/20 1252)  ED Course  I have reviewed the triage vital signs and the nursing notes.  Pertinent labs & imaging results that were available during my care of the patient were reviewed by me and considered in my medical decision making (see chart for details).  Clinical Course as of 10/03/20 1509  Tue Oct 03, 2020  1228 SARS Coronavirus 2 by RT PCR(!): POSITIVE [JS]  1429 Tetrahydrocannabinol(!): POSITIVE [JS]    Clinical Course User Index [JS] Janeece Fitting, PA-C   MDM Rules/Calculators/A&P    Patient with a past medical history of cannabinoid hyperemesis syndrome presents to the ED with a chief complaint of nausea, vomiting for the past 2 days.  Nonbilious, nonbloody emesis.  Has tried taking Pepto-Bismol without any improvement in symptoms.  No fevers, sick contacts, chest pain or shortness of breath.  Similar visits to the ED for the same complaint, chart was extensively reviewed, last visit being 6 months ago.  On today's visit there is no abdominal pain, patient is positive for COVID-19 infection.  Not tried taking any medication for improvement in his symptoms, as he reports running out of his Zofran.  EKG was checked prior to antiemetics provided, QTC is stable at this time, provided with 4 mg of Zofran along with fluids.  Primary evaluation, abdomen appears soft, no focal tenderness to palpation.  He is afebrile.  Denies any URI symptoms on today's  visit.  Interpretation of his labs revealed a CBC without any leukocytosis, no hematemesis, no blood in his stool, hemoglobin is stable on today's visit.  CMP without any electrolyte abnormality, creatinine levels normal, BUN slightly elevated suspect likely some signs of dehydration.  LFTs are within normal limits, denies any history of alcohol abuse.  UA is currently pending.  Vitals remain stable with an oxygen saturation 100%.  UDS positive for THC.  2:41 PM patient was p.o. trial, reports improvement in symptoms after fluids along with Zofran.  We discussed going home with a prescription for Zofran to help with his symptoms.  Abdomen remained soft, nontender to palpation, no further episodes of emesis.  Patient remains hemodynamically stable, stable for discharge.   Gevorg Brum was evaluated in Emergency Department on 10/03/2020 for the symptoms described in the history of present illness. He was evaluated in the context of the global COVID-19 pandemic, which necessitated consideration that the patient might be at risk for infection with the SARS-CoV-2 virus that causes COVID-19. Institutional protocols and algorithms that pertain to the evaluation of patients at risk for COVID-19 are in a state of rapid change based on information released by regulatory bodies including the CDC and federal and state organizations. These policies and algorithms were followed during the patient's care in the ED.   Portions of this note were generated with [employer]. Dictation errors may occur despite best attempts at proofreading.  Final Clinical Impression(s) / ED Diagnoses Final diagnoses:  Intractable vomiting with nausea, unspecified vomiting type  COVID-19 virus infection    Rx / DC Orders ED Discharge Orders         Ordered    ondansetron (ZOFRAN) 4 MG tablet  Every 6 hours        10/03/20 Grants, Zavon Hyson, PA-C 10/03/20 1509    Daleen Bo, MD 10/04/20  [phone]

## 2020-10-08 ENCOUNTER — Emergency Department (HOSPITAL_COMMUNITY)
Admission: EM | Admit: 2020-10-08 | Discharge: 2020-10-09 | Disposition: A | Discharge status: Home / Self Care | Payer: 59 | Attending: Emergency Medicine | Admitting: Emergency Medicine

## 2020-10-08 ENCOUNTER — Emergency Department (HOSPITAL_COMMUNITY): Payer: 59

## 2020-10-08 ENCOUNTER — Other Ambulatory Visit: Payer: Self-pay

## 2020-10-08 DIAGNOSIS — R0602 Shortness of breath: Secondary | ICD-10-CM | POA: Insufficient documentation

## 2020-10-08 DIAGNOSIS — Z5321 Procedure and treatment not carried out due to patient leaving prior to being seen by health care provider: Secondary | ICD-10-CM | POA: Diagnosis not present

## 2020-10-08 LAB — CBC
HCT: 32.5 % — ABNORMAL LOW (ref 39.0–52.0)
Hemoglobin: 11.8 g/dL — ABNORMAL LOW (ref 13.0–17.0)
MCH: 33.3 pg (ref 26.0–34.0)
MCHC: 36.3 g/dL — ABNORMAL HIGH (ref 30.0–36.0)
MCV: 91.8 fL (ref 80.0–100.0)
Platelets: 238 10*3/uL (ref 150–400)
RBC: 3.54 MIL/uL — ABNORMAL LOW (ref 4.22–5.81)
RDW: 12 % (ref 11.5–15.5)
WBC: 13.8 10*3/uL — ABNORMAL HIGH (ref 4.0–10.5)
nRBC: 0.1 % (ref 0.0–0.2)

## 2020-10-08 LAB — BASIC METABOLIC PANEL
Anion gap: 29 — ABNORMAL HIGH (ref 5–15)
BUN: 55 mg/dL — ABNORMAL HIGH (ref 6–20)
CO2: 15 mmol/L — ABNORMAL LOW (ref 22–32)
Calcium: 8.7 mg/dL — ABNORMAL LOW (ref 8.9–10.3)
Chloride: 85 mmol/L — ABNORMAL LOW (ref 98–111)
Creatinine, Ser: 2.61 mg/dL — ABNORMAL HIGH (ref 0.61–1.24)
GFR, Estimated: 30 mL/min — ABNORMAL LOW (ref >60)
Glucose, Bld: 221 mg/dL — ABNORMAL HIGH (ref 70–99)
Potassium: 3 mmol/L — ABNORMAL LOW (ref 3.5–5.1)
Sodium: 129 mmol/L — ABNORMAL LOW (ref 135–145)

## 2020-10-08 NOTE — ED Triage Notes (Signed)
C/O difficulty breathing and severe thirst. Denies NV. Stated all he can tolerate at home is water.

## 2020-10-09 NOTE — ED Notes (Signed)
Pt left due to not being seen quick enough 

## 2022-09-05 IMAGING — DX DG CHEST 1V PORT
1 series · 1 of 1 positions shown · non-contrast
Comparison: March 31, 2015

CLINICAL DATA: Shortness of breath

EXAM:
PORTABLE CHEST 1 VIEW

[chest ap]
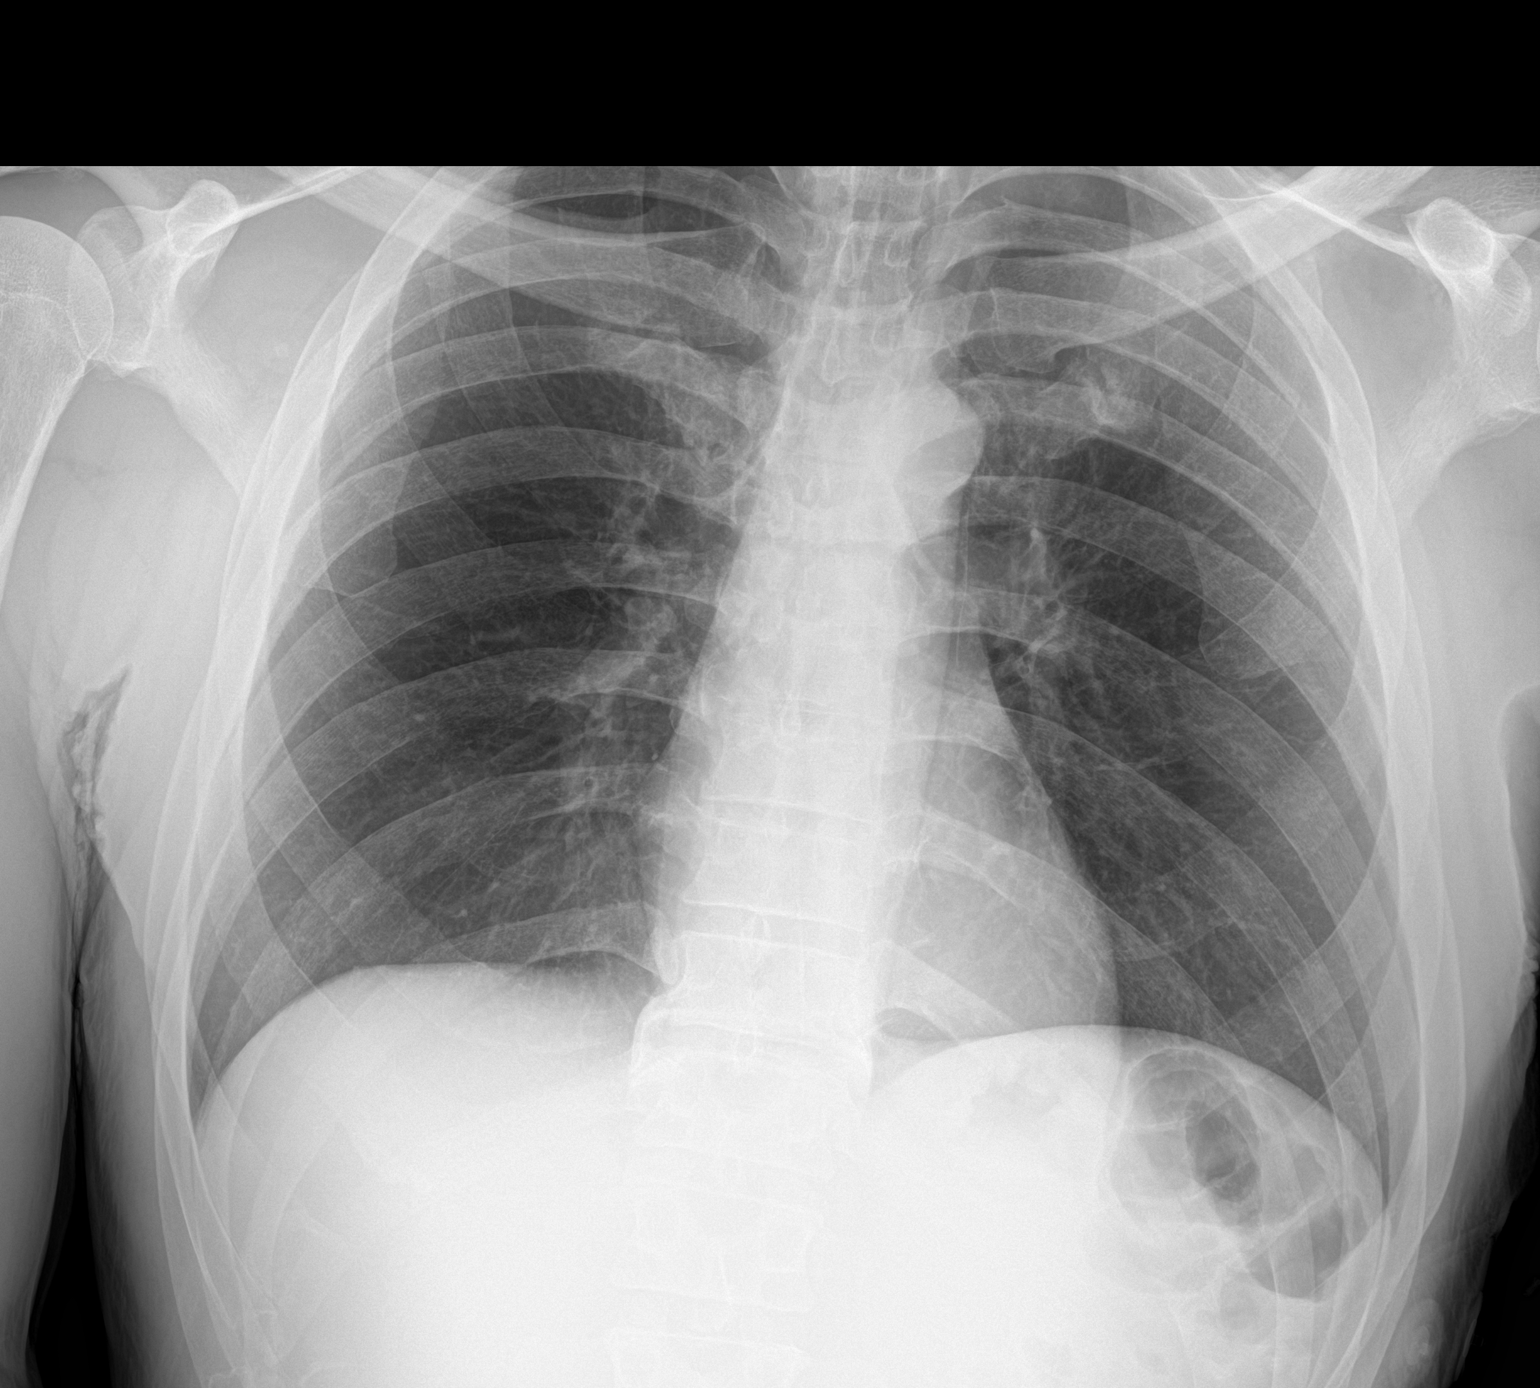

[1 of 1 positions shown; findings below may reference images not displayed]

FINDINGS: The heart size and mediastinal contours are within normal limits.
Both lungs are clear. The visualized skeletal structures are
unremarkable.
IMPRESSION: No active disease.
